# Patient Record
Sex: Male | Born: 1950
Health system: Southern US, Community
[De-identification: ages and names within clinical notes are randomized; demographics above are authoritative.]

## PROBLEM LIST (undated history)

## (undated) DIAGNOSIS — M1990 Unspecified osteoarthritis, unspecified site: Secondary | ICD-10-CM

## (undated) DIAGNOSIS — E785 Hyperlipidemia, unspecified: Secondary | ICD-10-CM

## (undated) DIAGNOSIS — I10 Essential (primary) hypertension: Secondary | ICD-10-CM

## (undated) HISTORY — PX: INGUINAL HERNIA REPAIR: SUR1180

## (undated) HISTORY — DX: Hyperlipidemia, unspecified: E78.5

## (undated) HISTORY — PX: WISDOM TOOTH EXTRACTION: SHX21

## (undated) HISTORY — DX: Unspecified osteoarthritis, unspecified site: M19.90

## (undated) HISTORY — DX: Essential (primary) hypertension: I10

---

## 2003-10-12 ENCOUNTER — Emergency Department (HOSPITAL_COMMUNITY): Admission: EM | Admit: 2003-10-12 | Discharge: 2003-10-12 | Payer: Self-pay | Admitting: Emergency Medicine

## 2003-10-14 ENCOUNTER — Emergency Department (HOSPITAL_COMMUNITY): Admission: EM | Admit: 2003-10-14 | Discharge: 2003-10-14 | Payer: Self-pay | Admitting: Emergency Medicine

## 2003-10-15 ENCOUNTER — Emergency Department (HOSPITAL_COMMUNITY): Admission: EM | Admit: 2003-10-15 | Discharge: 2003-10-15 | Payer: Self-pay | Admitting: Emergency Medicine

## 2004-04-27 ENCOUNTER — Ambulatory Visit: Payer: Self-pay | Admitting: Family Medicine

## 2005-01-05 ENCOUNTER — Ambulatory Visit: Payer: Self-pay | Admitting: Family Medicine

## 2005-01-07 ENCOUNTER — Ambulatory Visit: Payer: Self-pay | Admitting: Family Medicine

## 2005-08-27 ENCOUNTER — Ambulatory Visit: Payer: Self-pay | Admitting: Family Medicine

## 2006-01-13 ENCOUNTER — Ambulatory Visit: Payer: Self-pay | Admitting: Family Medicine

## 2006-07-01 ENCOUNTER — Ambulatory Visit (HOSPITAL_COMMUNITY): Admission: RE | Admit: 2006-07-01 | Discharge: 2006-07-01 | Payer: Self-pay | Admitting: Family Medicine

## 2006-07-01 ENCOUNTER — Encounter: Payer: Self-pay | Admitting: Family Medicine

## 2006-07-01 LAB — CONVERTED CEMR LAB
ALT: 9 units/L (ref 0–53)
Albumin: 4.5 g/dL (ref 3.5–5.2)
Alkaline Phosphatase: 106 units/L (ref 39–117)
BUN: 12 mg/dL (ref 6–23)
Basophils Absolute: 0 10*3/uL (ref 0.0–0.1)
Bilirubin, Direct: 0.2 mg/dL (ref 0.0–0.3)
Cholesterol: 192 mg/dL (ref 0–200)
Eosinophils Absolute: 0.3 10*3/uL (ref 0.0–0.7)
Glucose, Bld: 119 mg/dL — ABNORMAL HIGH (ref 70–99)
HDL: 46 mg/dL (ref >39)
Hemoglobin: 14.5 g/dL (ref 13.0–17.0)
Lymphs Abs: 2.4 10*3/uL (ref 0.7–3.3)
MCHC: 32.4 g/dL (ref 30.0–36.0)
Monocytes Relative: 6 % (ref 3–11)
Neutro Abs: 8.3 10*3/uL — ABNORMAL HIGH (ref 1.7–7.7)
PSA: 0.39 ng/mL
TSH: 0.586 microintl units/mL (ref 0.350–5.50)
Total Bilirubin: 0.8 mg/dL (ref 0.3–1.2)
Total CHOL/HDL Ratio: 4.2
Total Protein: 7.7 g/dL (ref 6.0–8.3)
Triglycerides: 92 mg/dL (ref <150)

## 2006-07-04 ENCOUNTER — Encounter: Payer: Self-pay | Admitting: Family Medicine

## 2006-07-04 LAB — CONVERTED CEMR LAB: Hgb A1c MFr Bld: 5.1 % (ref 4.6–6.1)

## 2006-07-05 ENCOUNTER — Ambulatory Visit: Payer: Self-pay | Admitting: Family Medicine

## 2006-07-05 DIAGNOSIS — I10 Essential (primary) hypertension: Secondary | ICD-10-CM

## 2006-07-06 ENCOUNTER — Ambulatory Visit (HOSPITAL_COMMUNITY): Admission: RE | Admit: 2006-07-06 | Discharge: 2006-07-06 | Payer: Self-pay | Admitting: Family Medicine

## 2006-08-09 ENCOUNTER — Ambulatory Visit: Payer: Self-pay | Admitting: Family Medicine

## 2007-02-02 ENCOUNTER — Ambulatory Visit: Payer: Self-pay | Admitting: Family Medicine

## 2007-05-18 ENCOUNTER — Encounter: Payer: Self-pay | Admitting: Family Medicine

## 2007-05-26 ENCOUNTER — Encounter: Payer: Self-pay | Admitting: Family Medicine

## 2007-05-26 DIAGNOSIS — R319 Hematuria, unspecified: Secondary | ICD-10-CM

## 2007-05-26 DIAGNOSIS — F528 Other sexual dysfunction not due to a substance or known physiological condition: Secondary | ICD-10-CM

## 2007-05-26 DIAGNOSIS — M479 Spondylosis, unspecified: Secondary | ICD-10-CM | POA: Insufficient documentation

## 2007-05-26 DIAGNOSIS — F17200 Nicotine dependence, unspecified, uncomplicated: Secondary | ICD-10-CM

## 2007-05-26 DIAGNOSIS — M1990 Unspecified osteoarthritis, unspecified site: Secondary | ICD-10-CM | POA: Insufficient documentation

## 2007-05-26 DIAGNOSIS — E785 Hyperlipidemia, unspecified: Secondary | ICD-10-CM

## 2007-05-26 DIAGNOSIS — M19019 Primary osteoarthritis, unspecified shoulder: Secondary | ICD-10-CM | POA: Insufficient documentation

## 2007-08-10 ENCOUNTER — Ambulatory Visit: Payer: Self-pay | Admitting: Family Medicine

## 2008-02-01 ENCOUNTER — Telehealth: Payer: Self-pay | Admitting: Family Medicine

## 2008-02-06 ENCOUNTER — Telehealth: Payer: Self-pay | Admitting: Family Medicine

## 2008-02-13 ENCOUNTER — Telehealth: Payer: Self-pay | Admitting: Family Medicine

## 2008-10-12 ENCOUNTER — Emergency Department (HOSPITAL_COMMUNITY): Admission: EM | Admit: 2008-10-12 | Discharge: 2008-10-12 | Payer: Self-pay | Admitting: Emergency Medicine

## 2009-01-17 ENCOUNTER — Emergency Department (HOSPITAL_COMMUNITY): Admission: EM | Admit: 2009-01-17 | Discharge: 2009-01-17 | Payer: Self-pay | Admitting: Emergency Medicine

## 2009-01-21 ENCOUNTER — Emergency Department (HOSPITAL_COMMUNITY): Admission: EM | Admit: 2009-01-21 | Discharge: 2009-01-21 | Payer: Self-pay | Admitting: Emergency Medicine

## 2009-11-25 ENCOUNTER — Encounter: Payer: Self-pay | Admitting: Family Medicine

## 2010-06-16 NOTE — Letter (Signed)
Summary: Historic Patient File  Historic Patient File   Imported By: Lind Guest 11/20/2009 13:27:21  _____________________________________________________________________  External Attachment:    Type:   Image     Comment:   External Document

## 2010-06-16 NOTE — Letter (Signed)
Summary: Letter  Letter   Imported By: Lind Guest 11/25/2009 16:41:46  _____________________________________________________________________  External Attachment:    Type:   Image     Comment:   External Document

## 2015-01-28 ENCOUNTER — Ambulatory Visit (AMBULATORY_SURGERY_CENTER): Payer: Self-pay | Admitting: *Deleted

## 2015-01-28 VITALS — Ht 71.0 in | Wt 207.0 lb

## 2015-01-28 DIAGNOSIS — Z1211 Encounter for screening for malignant neoplasm of colon: Secondary | ICD-10-CM

## 2015-01-28 NOTE — Progress Notes (Signed)
Patient denies any allergies to egg or soy products. No prior surgical history/sedation.  Patient denies oxygen use at home and denies diet medications. Emmi instructions for colonoscopy explained and given to patient.

## 2015-01-28 NOTE — Progress Notes (Signed)
Patient is on a blood pressure medication.  Patient is not sure of name of medication.  Patient will bring name of blood pressure medication with him on day of the colonoscopy procedure.

## 2015-02-11 ENCOUNTER — Ambulatory Visit (AMBULATORY_SURGERY_CENTER): Payer: Medicare Other | Admitting: Gastroenterology

## 2015-02-11 ENCOUNTER — Encounter: Payer: Self-pay | Admitting: Gastroenterology

## 2015-02-11 VITALS — BP 119/70 | HR 56 | Temp 96.4°F | Resp 20 | Ht 71.0 in | Wt 207.0 lb

## 2015-02-11 DIAGNOSIS — D129 Benign neoplasm of anus and anal canal: Secondary | ICD-10-CM

## 2015-02-11 DIAGNOSIS — D122 Benign neoplasm of ascending colon: Secondary | ICD-10-CM | POA: Diagnosis not present

## 2015-02-11 DIAGNOSIS — D128 Benign neoplasm of rectum: Secondary | ICD-10-CM | POA: Diagnosis not present

## 2015-02-11 DIAGNOSIS — Z1211 Encounter for screening for malignant neoplasm of colon: Secondary | ICD-10-CM | POA: Diagnosis not present

## 2015-02-11 DIAGNOSIS — D124 Benign neoplasm of descending colon: Secondary | ICD-10-CM

## 2015-02-11 DIAGNOSIS — D127 Benign neoplasm of rectosigmoid junction: Secondary | ICD-10-CM

## 2015-02-11 DIAGNOSIS — D123 Benign neoplasm of transverse colon: Secondary | ICD-10-CM

## 2015-02-11 DIAGNOSIS — K635 Polyp of colon: Secondary | ICD-10-CM

## 2015-02-11 DIAGNOSIS — D125 Benign neoplasm of sigmoid colon: Secondary | ICD-10-CM

## 2015-02-11 MED ORDER — SODIUM CHLORIDE 0.9 % IV SOLN: 500.0000 mL | INTRAVENOUS | Status: DC

## 2015-02-11 NOTE — Progress Notes (Signed)
tO RECOVERY, REPORT TO wILLIS, rn, vss

## 2015-02-11 NOTE — Patient Instructions (Addendum)
YOU HAD AN ENDOSCOPIC PROCEDURE TODAY AT [facility]:   Refer to the procedure report that was given to you for any specific questions about what was found during the examination.  If the procedure report does not answer your questions, please call your gastroenterologist to clarify.  If you requested that your care partner not be given the details of your procedure findings, then the procedure report has been included in a sealed envelope for you to review at your convenience later.  YOU SHOULD EXPECT: Some feelings of bloating in the abdomen. Passage of more gas than usual.  Walking can help get rid of the air that was put into your GI tract during the procedure and reduce the bloating. If you had a lower endoscopy (such as a colonoscopy or flexible sigmoidoscopy) you may notice spotting of blood in your stool or on the toilet paper. If you underwent a bowel prep for your procedure, you may not have a normal bowel movement for a few days.  Please Note:  You might notice some irritation and congestion in your nose or some drainage.  This is from the oxygen used during your procedure.  There is no need for concern and it should clear up in a day or so.  SYMPTOMS TO REPORT IMMEDIATELY:   Following lower endoscopy (colonoscopy or flexible sigmoidoscopy):  Excessive amounts of blood in the stool  Significant tenderness or worsening of abdominal pains  Swelling of the abdomen that is new, acute  Fever of 100F or higher   For urgent or emergent issues, a gastroenterologist can be reached at any hour by calling [phone].   DIET: Your first meal following the procedure should be a small meal and then it is ok to progress to your normal diet. Heavy or fried foods are harder to digest and may make you feel nauseous or bloated.  Likewise, meals heavy in dairy and vegetables can increase bloating.  Drink plenty of fluids but you should avoid alcoholic beverages for 24  hours.  ACTIVITY:  You should plan to take it easy for the rest of today and you should NOT DRIVE or use heavy machinery until tomorrow (because of the sedation medicines used during the test).    FOLLOW UP: Our staff will call the number listed on your records the next business day following your procedure to check on you and address any questions or concerns that you may have regarding the information given to you following your procedure. If we do not reach you, we will leave a message.  However, if you are feeling well and you are not experiencing any problems, there is no need to return our call.  We will assume that you have returned to your regular daily activities without incident.  If any biopsies were taken you will be contacted by phone or by letter within the next 1-3 weeks.  Please call us at [phone] if you have not heard about the biopsies in 3 weeks.    SIGNATURES/CONFIDENTIALITY: You and/or your care partner have signed paperwork which will be entered into your electronic medical record.  These signatures attest to the fact that that the information above on your After Visit Summary has been reviewed and is understood.  Full responsibility of the confidentiality of this discharge information lies with you and/or your care-partner.    Handouts were given to your care partner on polyps, hemorrhoids, and a high fiber diet with liberal fluid intake Avoid all aspirin  and NSAIDS for 2 weeks to minimize bleeding from where polyps were removed. You may resume your other current medications today. Await biopsy results. Please call if any questions or concerns.

## 2015-02-11 NOTE — Progress Notes (Signed)
No problems noted in the recovery room. maw 

## 2015-02-11 NOTE — Op Note (Signed)
[place]  [address] [place], [id number]   COLONOSCOPY PROCEDURE REPORT  PATIENT: Albert Martin, Albert Martin  MR#: [id number] BIRTHDATE: [birth year] , 72  yrs. old GENDER: male ENDOSCOPIST: Samak Cellar, MD REFERRED BY: Jenell Milliner, MD PROCEDURE DATE:  02/11/2015 PROCEDURE:   Colonoscopy with snare polypectomy and Submucosal injection, any substance First Screening Colonoscopy - Avg.  risk and is 50 yrs.  old or older Yes.  Prior Negative Screening - Now for repeat screening. N/A  History of Adenoma - Now for follow-up colonoscopy & has been > or = to 3 yrs.  N/A  Polyps removed today? Yes ASA CLASS:   Class II INDICATIONS:Screening for colonic neoplasia and Colorectal Neoplasm Risk Assessment for this procedure is average risk. MEDICATIONS: Propofol 300 mg IV  DESCRIPTION OF PROCEDURE:   After the risks benefits and alternatives of the procedure were thoroughly explained, informed consent was obtained.  The digital rectal exam revealed no abnormalities of the rectum.   The LB [id number]  endoscope was introduced through the anus and advanced to the cecum, which was identified by both the appendix and ileocecal valve. No adverse events experienced.   The quality of the prep was good.  The instrument was then slowly withdrawn as the colon was fully examined. Estimated blood loss is zero unless otherwise noted in this procedure report.      COLON FINDINGS: Thirteen sessile polyps ranging from 3 to 61mm in size were found in the transverse colon, at the splenic flexure, in the ascending colon, at the hepatic flexure, and in the rectum. Polypectomies were performed with a cold snare.  The resection was complete, the polyp tissue was completely retrieved and sent to histology.   Three semi-pedunculated polyps ranging from 5 to 79mm in size were found in the sigmoid colon and descending colon. Polypectomies were performed using snare cautery.  The  resection was complete, the polyp tissue was completely retrieved and sent to histology.   A pedunculated polyp measuring 25 to 30 mm in size was found in the rectosigmoid colon.  Upon intubation of the colon, a 1:10,000 Epinephrine solution injection was given to minimize the head of the polyp to allow the snare to fit around it. A polypectomy was then performed using snare cautery.  The resection was complete, the polyp tissue was completely retrieved and sent to histology.  There was a large stalk to the polyp and given the patient's NSAID usage, a prophylactic hemostasis clips was successfully placed across the defect to prevent bleeding. The examination was otherwise normal.  Retroflexed views revealed internal hemorrhoids. The time to cecum =    Withdrawal time = The scope was withdrawn and the procedure completed. COMPLICATIONS: There were no immediate complications.  ENDOSCOPIC IMPRESSION: 1.   Thirteen sessile polyps ranging from 3 to 69mm in size were found in the transverse colon (4), at the splenic flexure (1), in the ascending colon (1), and at the hepatic flexure (3), and in the rectum (4); polypectomies were performed with a cold snare 2.   Three semi-pedunculated polyps ranging from 5 to 80mm in size were found in the sigmoid colon and descending colon; polypectomies were performed using snare cautery 3.   Pedunculated polyp was found in the rectosigmoid colon; injection was given to allow the snare to fit around the head of the polyp.  1:10,000 Epinephrine solution; polypectomy was performed using snare cautery. Prophylactic hemostasis clip placed.  4.   The examination was otherwise normal  RECOMMENDATIONS: 1.  Hold Aspirin and all other NSAIDS for 2 weeks to minimize bleeding from polypectomy site. 2.  Resume diet 3.  Resume medications 4.  Await pathology results.  Anticipate repeat colonoscopy will be warranted in one year for surveillance purposes.  eSigned:    Cellar, MD 02/11/2015 11:23 AM   cc: Jenell Milliner, MD, the patient   PATIENT NAME:  Albert Martin, Albert Martin MR#: [id number]

## 2015-02-12 ENCOUNTER — Telehealth: Payer: Self-pay | Admitting: *Deleted

## 2015-02-12 NOTE — Telephone Encounter (Signed)
  Follow up Call-  Call back number 02/11/2015  Post procedure Call Back phone  # [phone]  Permission to leave phone message Yes   No answer, no machine to leave message

## 2015-02-14 NOTE — Telephone Encounter (Signed)
Phone call in error. maw

## 2015-02-19 ENCOUNTER — Encounter: Payer: Self-pay | Admitting: Gastroenterology

## 2015-12-08 ENCOUNTER — Encounter: Payer: Self-pay | Admitting: Gastroenterology

## 2018-06-12 ENCOUNTER — Ambulatory Visit (INDEPENDENT_AMBULATORY_CARE_PROVIDER_SITE_OTHER): Payer: Medicare Other | Admitting: Podiatry

## 2018-06-12 ENCOUNTER — Ambulatory Visit (INDEPENDENT_AMBULATORY_CARE_PROVIDER_SITE_OTHER): Payer: Medicare Other

## 2018-06-12 ENCOUNTER — Other Ambulatory Visit: Payer: Self-pay | Admitting: Podiatry

## 2018-06-12 VITALS — BP 164/86 | HR 87

## 2018-06-12 DIAGNOSIS — M79671 Pain in right foot: Secondary | ICD-10-CM

## 2018-06-12 DIAGNOSIS — M722 Plantar fascial fibromatosis: Secondary | ICD-10-CM | POA: Diagnosis not present

## 2018-06-12 MED ORDER — METHYLPREDNISOLONE 4 MG PO TBPK: ORAL_TABLET | ORAL | 0 refills | Status: DC

## 2018-06-12 MED ORDER — TRIAMCINOLONE ACETONIDE 10 MG/ML IJ SUSP
10.0000 mg | Freq: Once | INTRAMUSCULAR | Status: AC
  Administered 2018-06-12: 10 mg

## 2018-06-12 NOTE — Patient Instructions (Signed)

## 2018-06-12 NOTE — Progress Notes (Signed)
Subjective:   Patient ID: Albert Martin, male   DOB: 68 y.o.   MRN: [id number]   HPI Mr. Gunn presents to the office today for concerns of pain to the bottom of his right heel which is been ongoing for the last 12 days.  He denies any recent injury to the area he has not seen much swelling.  He states that it was gradually coming on for the first 4 days and then became more significant on the bottom of the heel which is worse in the morning when he first gets up after being on his feet all day.  He denies any recent injury or trauma.  He has tried a gel cushion for shoes as well as tramadol.  He is applied ice as well as Epsom salts soaks.   Review of Systems  All other systems reviewed and are negative.  Past Medical History:  Diagnosis Date  . Hypertension     Past Surgical History:  Procedure Laterality Date  . WISDOM TOOTH EXTRACTION       Current Outpatient Medications:  .  hydrochlorothiazide (HYDRODIURIL) 25 MG tablet, Take 25 mg by mouth daily., Disp: , Rfl:  .  meloxicam (MOBIC) 15 MG tablet, Take 15 mg by mouth daily., Disp: , Rfl:  .  Naproxen Sodium (ALEVE) 220 MG CAPS, Take 220 mg by mouth 2 (two) times daily., Disp: , Rfl:  .  sildenafil (REVATIO) 20 MG tablet, Take 20 mg by mouth., Disp: , Rfl: 0 .  testosterone cypionate (DEPOTESTOSTERONE CYPIONATE) 200 MG/ML injection, , Disp: , Rfl:  .  amLODipine (NORVASC) 5 MG tablet, Take 5 mg by mouth daily., Disp: , Rfl:  .  methylPREDNISolone (MEDROL DOSEPAK) 4 MG TBPK tablet, Take as directed, Disp: 21 tablet, Rfl: 0 .  rosuvastatin (CRESTOR) 10 MG tablet, Take 1 tablet by mouth daily before going to sleep, Disp: , Rfl:  .  traMADol (ULTRAM) 50 MG tablet, Take 50-100 mg by mouth every 6 (six) hours as needed. for pain, Disp: , Rfl:   No Known Allergies        Objective:  Physical Exam  General: AAO x3, NAD  Dermatological: Skin is warm, dry and supple bilateral. Nails x 10 are well manicured; remaining  integument appears unremarkable at this time. There are no open sores, no preulcerative lesions, no rash or signs of infection present.  Vascular: Dorsalis Pedis artery and Posterior Tibial artery pedal pulses are 2/4 bilateral with immedate capillary fill time.  There is no pain with calf compression, swelling, warmth, erythema.   Neruologic: Grossly intact via light touch bilateral. Vibratory intact via tuning fork bilateral. Protective threshold with Semmes Wienstein monofilament intact to all pedal sites bilateral. Negative tinel sign.   Musculoskeletal: Tenderness to palpation along the plantar medial tubercle of the calcaneus at the insertion of plantar fascia on the right foot. There is no pain along the course of the plantar fascia within the arch of the foot. Plantar fascia appears to be intact. There is no pain with lateral compression of the calcaneus or pain with vibratory sensation. There is no pain along the course or insertion of the achilles tendon. No other areas of tenderness to bilateral lower extremities. Muscular strength 5/5 in all groups tested bilateral.  Gait: Unassisted, Nonantalgic.       Assessment:   Right heel pain, plantar fasciitis    Plan:  -Treatment options discussed including all alternatives, risks, and complications -Etiology of symptoms were discussed -X-rays were  obtained and reviewed with the patient.  Flatfoot is present.  Mild arthritic changes present.  No evidence of acute fracture or stress fracture.  Very minimal tiny inferior heel spur. -Steroid injection performed.  See procedure note below. -Plantar fascial brace dispensed. -Discussed shoe modifications and orthotics.  Power steps dispensed. -Stretching, icing daily  Procedure: Injection Tendon/Ligament Discussed alternatives, risks, complications and verbal consent was obtained.  Location: Right plantar fascia at the glabrous junction; medial approach. Skin Prep: Alcohol. Injectate:  0.5cc 0.5% marcaine plain, 0.5 cc 2% lidocaine plain and, 1 cc kenalog 10. Disposition: Patient tolerated procedure well. Injection site dressed with a band-aid.  Post-injection care was discussed and return precautions discussed.     Trula Slade DPM

## 2018-07-03 ENCOUNTER — Ambulatory Visit: Payer: Medicare Other | Admitting: Podiatry

## 2018-07-17 ENCOUNTER — Ambulatory Visit: Payer: Medicare Other | Admitting: Podiatry

## 2018-09-15 ENCOUNTER — Ambulatory Visit: Payer: Medicare Other | Attending: Internal Medicine | Admitting: Internal Medicine

## 2018-09-15 ENCOUNTER — Encounter: Payer: Self-pay | Admitting: Internal Medicine

## 2018-09-15 ENCOUNTER — Ambulatory Visit: Payer: Medicare Other | Admitting: Internal Medicine

## 2018-09-15 ENCOUNTER — Other Ambulatory Visit: Payer: Self-pay

## 2018-09-15 VITALS — BP 137/86 | HR 80 | Temp 99.2°F | Resp 16 | Ht 71.0 in | Wt 208.0 lb

## 2018-09-15 DIAGNOSIS — Z791 Long term (current) use of non-steroidal anti-inflammatories (NSAID): Secondary | ICD-10-CM | POA: Insufficient documentation

## 2018-09-15 DIAGNOSIS — N529 Male erectile dysfunction, unspecified: Secondary | ICD-10-CM | POA: Diagnosis not present

## 2018-09-15 DIAGNOSIS — Z8249 Family history of ischemic heart disease and other diseases of the circulatory system: Secondary | ICD-10-CM | POA: Diagnosis not present

## 2018-09-15 DIAGNOSIS — I10 Essential (primary) hypertension: Secondary | ICD-10-CM | POA: Diagnosis not present

## 2018-09-15 DIAGNOSIS — R7989 Other specified abnormal findings of blood chemistry: Secondary | ICD-10-CM | POA: Insufficient documentation

## 2018-09-15 DIAGNOSIS — E785 Hyperlipidemia, unspecified: Secondary | ICD-10-CM | POA: Insufficient documentation

## 2018-09-15 DIAGNOSIS — Z87891 Personal history of nicotine dependence: Secondary | ICD-10-CM | POA: Diagnosis not present

## 2018-09-15 DIAGNOSIS — Z86010 Personal history of colonic polyps: Secondary | ICD-10-CM | POA: Diagnosis not present

## 2018-09-15 DIAGNOSIS — M150 Primary generalized (osteo)arthritis: Secondary | ICD-10-CM

## 2018-09-15 DIAGNOSIS — Z125 Encounter for screening for malignant neoplasm of prostate: Secondary | ICD-10-CM | POA: Insufficient documentation

## 2018-09-15 DIAGNOSIS — Z23 Encounter for immunization: Secondary | ICD-10-CM

## 2018-09-15 DIAGNOSIS — M1991 Primary osteoarthritis, unspecified site: Secondary | ICD-10-CM | POA: Diagnosis not present

## 2018-09-15 DIAGNOSIS — M159 Polyosteoarthritis, unspecified: Secondary | ICD-10-CM

## 2018-09-15 DIAGNOSIS — Z79899 Other long term (current) drug therapy: Secondary | ICD-10-CM | POA: Insufficient documentation

## 2018-09-15 MED ORDER — TADALAFIL 20 MG PO TABS: ORAL_TABLET | ORAL | 6 refills | Status: DC

## 2018-09-15 MED ORDER — AMLODIPINE BESYLATE 10 MG PO TABS: 10.0000 mg | ORAL_TABLET | Freq: Every day | ORAL | 3 refills | Status: DC

## 2018-09-15 NOTE — Patient Instructions (Signed)
Increase Amlodipine to 10 mg daily.  Continue healthy eating habits and regular exercise. Come fasting to the lab on Monday.

## 2018-09-15 NOTE — Progress Notes (Signed)
Patient ID: Albert Martin, male    DOB: 1950/12/08  MRN: [id number]  CC: New Patient (Initial Visit)   Subjective: Albert Martin is a 68 y.o. male who presents for new pt visit His concerns today include:  Patient with history of HTN, HL, ED, low Testosterone, CTS,  Last PCP was at [facility].  Last seen there 3 mths ago.  Decided to change because he states the primary physician was never there, always saw a NP and took 4 hrs to be seen.  He was being treated for HTN, HL, ED low testosterone on inj Q 2 wks for past 2 yrs and arthritis.    ED/Low T: He has been on testosterone injections every 2 weeks for about 2 years for low testosterone level.  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  He last took a shot about 3 to 4 weeks ago.  He still has some at home but is saving it to use when he returns to work in about 3 weeks.  He states that it helps improve his energy.   He is not sure whether his PSA level and CBC were being followed since he is been on testosterone. -passing urine okay.  No Nocturia.   -Cialis works well for him for ED.  Requests refill.  HL:  Dx 1.5 yrs ago and started on Crestor at that time.  "I've change my diet.  Cut down on that fried chicken and all the other nonsense."  HTN:  Checks BP daily.  Gives range 128-138/82-88.  Compliant with meds.  -limits salt in foods. No CP/SOB/LE/HA/dizziness Exercises 2-4 x a wk.  Has wghs and stationary bike at home  Former smoker:  Quit 9 yrs ago  OA:  Mainly R shoulder and knee.  Meloxicam works but it takes a while. Takes Tylenol 650 mg as back up when he has flares like in cold or rainy weather.  HM: c-scope 2 yrs ago where 17 polyps removed in 2016 by Dr. Havery Moros.  He was supposed to have a repeat colonoscopy the following year but never did he has not had the pneumonia vaccine series.   States that he is overdue for tetanus vaccine.  Social history, family history and past surgical history reviewed  Patient Active Problem List   Diagnosis Date Noted  . DYSLIPIDEMIA 05/26/2007  . ERECTILE DYSFUNCTION 05/26/2007  . TOBACCO ABUSE 05/26/2007  . HEMATURIA UNSPECIFIED 05/26/2007  . OSTEOARTHRITIS 05/26/2007  . ARTHRITIS, SHOULDERS, BILATERAL 05/26/2007  . ARTHRITIS, BACK 05/26/2007  . HYPERTENSION 07/05/2006     Current Outpatient Medications on File Prior to Visit  Medication Sig Dispense Refill  . tadalafil (ADCIRCA/CIALIS) 20 MG tablet Take 20 mg by mouth.    . hydrochlorothiazide (HYDRODIURIL) 25 MG tablet Take 25 mg by mouth daily.    . meloxicam (MOBIC) 15 MG tablet Take 15 mg by mouth daily.    . rosuvastatin (CRESTOR) 10 MG tablet Take 1 tablet by mouth daily before going to sleep    . testosterone cypionate (DEPOTESTOSTERONE CYPIONATE) 200 MG/ML injection      No current facility-administered medications on file prior to visit.     No Known Allergies  Social History   Socioeconomic History  . Marital status: Married    Spouse name: Not on file  . Number of children: 4  . Years of education: Not  on file  . Highest education level: Not on file  Occupational History  . Not on file  Social Needs  . Financial resource strain: Not on file  . Food insecurity:    Worry: Not on file    Inability: Not on file  . Transportation needs:    Medical: Not on file    Non-medical: Not on file  Tobacco Use  . Smoking status: Former Smoker    Packs/day: 0.50    Years: 10.00    Pack years: 5.00    Types: Cigarettes    Last attempt to quit: 08/15/2009    Years since quitting: 9.0  . Smokeless tobacco: Never Used  Substance and Sexual Activity  . Alcohol use: No    Alcohol/week: 0.0 standard drinks  . Drug use: No  . Sexual activity: Yes  Lifestyle  . Physical activity:    Days per week: Not on file    Minutes per session: Not on file  . Stress: Not on  file  Relationships  . Social connections:    Talks on phone: Not on file    Gets together: Not on file    Attends religious service: Not on file    Active member of club or organization: Not on file    Attends meetings of clubs or organizations: Not on file    Relationship status: Not on file  . Intimate partner violence:    Fear of current or ex partner: Not on file    Emotionally abused: Not on file    Physically abused: Not on file    Forced sexual activity: Not on file  Other Topics Concern  . Not on file  Social History Narrative  . Not on file    Family History  Problem Relation Age of Onset  . Lung cancer Mother   . Hypertension Mother   . Arthritis Mother   . Hypertension Father   . Arthritis Father   . Hypertension Brother   . Arthritis Brother   . Lung cancer Maternal Aunt   . Colon cancer Neg Hx   . Colon polyps Neg Hx   . Esophageal cancer Neg Hx   . Rectal cancer Neg Hx   . Stomach cancer Neg Hx     Past Surgical History:  Procedure Laterality Date  . WISDOM TOOTH EXTRACTION      ROS: Review of Systems  Constitutional: Negative for appetite change.  HENT: Negative for congestion and hearing loss.        He wears dentures above and below  Eyes: Negative for visual disturbance.       Last eye exam was about 2 years ago.  He wears prescription glasses  Respiratory: Negative for cough and chest tightness.   Gastrointestinal: Negative for abdominal pain and blood in stool.  Genitourinary: Negative for difficulty urinating and dysuria.   Negative except as stated above  PHYSICAL EXAM: BP 137/86   Pulse 80   Temp 99.2 F (37.3 C) (Oral)   Resp 16   Ht 5\' 11"  (1.803 m)   Wt 208 lb (94.3 kg)   SpO2 95%   BMI 29.01 kg/m   Physical Exam Repeat blood pressure 134/90 General appearance - alert, well appearing, African-American male who appears younger than his stated age and in no distress Mental status - normal mood, behavior, speech, dress,  motor activity, and thought processes Eyes - pupils equal and reactive, extraocular eye movements intact Ears - bilateral TM's and external ear canals  normal Nose - normal and patent, no erythema, discharge or polyps Mouth -throat is without erythema or exudates.  He is wearing his dentures above and below Neck - supple, no masses appreciated.  No thyroid enlargement Lymphatics -no cervical, axillary lymphadenopathy  chest - clear to auscultation, no wheezes, rales or rhonchi, symmetric air entry Heart - normal rate, regular rhythm, normal S1, S2, no murmurs, rubs, clicks or gallops Abdomen - soft, nontender, nondistended, no masses or organomegaly Extremities -no lower extremity edema.  Good peripheral pulses.  CMP Latest Ref Rng & Units 07/01/2006  Glucose 70 - 99 mg/dL 119(H)  BUN 6 - 23 mg/dL 12  Creatinine 0.40 - 1.50 mg/dL 0.94  Sodium 135 - 145 meq/L 141  Potassium 3.5 - 5.3 meq/L 3.9  Chloride 96 - 112 meq/L 105  CO2 19 - 32 meq/L 22  Calcium 8.4 - 10.5 mg/dL 9.5  Total Protein 6.0 - 8.3 g/dL 7.7  Total Bilirubin 0.3 - 1.2 mg/dL 0.8  Alkaline Phos 39 - 117 units/L 106  AST 0 - 37 units/L 11  ALT 0 - 53 units/L 9   Lipid Panel     Component Value Date/Time   CHOL 192 07/01/2006 2201   TRIG 92 07/01/2006 2201   HDL 46 07/01/2006 2201   CHOLHDL 4.2 Ratio 07/01/2006 2201   VLDL 18 07/01/2006 2201   LDLCALC 128 (H) 07/01/2006 2201    CBC    Component Value Date/Time   WBC 11.6 (H) 07/01/2006 2201   RBC 5.22 07/01/2006 2201   HGB 14.5 07/01/2006 2201   HCT 44.8 07/01/2006 2201   PLT 254 07/01/2006 2201   MCV 85.8 07/01/2006 2201   MCHC 32.4 07/01/2006 2201   RDW 13.4 07/01/2006 2201   LYMPHSABS 2.4 07/01/2006 2201   MONOABS 0.7 07/01/2006 2201   EOSABS 0.3 07/01/2006 2201   BASOSABS 0.0 07/01/2006 2201    ASSESSMENT AND PLAN: 1. Essential hypertension Not at goal.  Increase amlodipine from 5 mg daily to 10 mg daily.  Continue hydrochlorothiazide.  Encourage  him to continue low-salt diet.  Continue regular exercise. - amLODipine (NORVASC) 10 MG tablet; Take 1 tablet (10 mg total) by mouth daily.  Dispense: 90 tablet; Refill: 3 - CBC; Future - Comprehensive metabolic panel; Future  2. Hyperlipidemia, unspecified hyperlipidemia type Commended him on dietary changes and encouraged him to continue making good food choices. - Lipid panel; Future  3. History of colon polyps Patient agreeable to referral for colonoscopy.  He is overdue. - Ambulatory referral to Gastroenterology  4. Low testosterone level in male Discussed with patient that we do need to keep an eye on CBC, PSA and lipid profile while he is on testosterone supplement. - PSA; Future - Testosterone; Future  5. Erectile dysfunction, unspecified erectile dysfunction type - PSA; Future - Testosterone; Future - tadalafil (ADCIRCA/CIALIS) 20 MG tablet; 1 tab 1/2 hr prior to intercourse PRN  Dispense: 10 tablet; Refill: 6  6. Primary osteoarthritis involving multiple joints Encouraged him to continue regular exercise.  Continue meloxicam  7. Prostate cancer screening Discussed limitations of prostate cancer screening with PSA.  However we are also doing a PSA because he is on testosterone injections - PSA; Future  HM: Patient is due for Prevnar 13 and Tdap.  Our clinical pharmacist needs to run this against his insurance.  We will plan to give those vaccines when he returns to the lab on Monday to have his blood test done.  Advised patient to sign a  release for me to get his medical records from his previous PCP  Patient was given the opportunity to ask questions.  Patient verbalized understanding of the plan and was able to repeat key elements of the plan.   Orders Placed This Encounter  Procedures  . CBC  . Comprehensive metabolic panel  . Lipid panel  . PSA  . Testosterone  . Ambulatory referral to Gastroenterology     Requested Prescriptions   Signed Prescriptions  Disp Refills  . amLODipine (NORVASC) 10 MG tablet 90 tablet 3    Sig: Take 1 tablet (10 mg total) by mouth daily.    Return in about 3 months (around 12/16/2018).  Karle Plumber, MD, FACP

## 2018-09-18 ENCOUNTER — Other Ambulatory Visit: Payer: Self-pay

## 2018-09-18 ENCOUNTER — Ambulatory Visit: Payer: Medicare Other | Attending: Family Medicine

## 2018-09-18 DIAGNOSIS — E785 Hyperlipidemia, unspecified: Secondary | ICD-10-CM

## 2018-09-18 DIAGNOSIS — Z125 Encounter for screening for malignant neoplasm of prostate: Secondary | ICD-10-CM

## 2018-09-18 DIAGNOSIS — Z23 Encounter for immunization: Secondary | ICD-10-CM | POA: Diagnosis not present

## 2018-09-18 DIAGNOSIS — N529 Male erectile dysfunction, unspecified: Secondary | ICD-10-CM

## 2018-09-18 DIAGNOSIS — I10 Essential (primary) hypertension: Secondary | ICD-10-CM

## 2018-09-18 DIAGNOSIS — R7989 Other specified abnormal findings of blood chemistry: Secondary | ICD-10-CM

## 2018-09-18 MED ORDER — TETANUS-DIPHTH-ACELL PERTUSSIS 5-2.5-18.5 LF-MCG/0.5 IM SUSP: 0.5000 mL | INTRAMUSCULAR | 0 refills | Status: DC

## 2018-09-18 MED ORDER — TETANUS-DIPHTH-ACELL PERTUSSIS 5-2-15.5 LF-MCG/0.5 IM SUSP: 0.5000 mL | Freq: Once | INTRAMUSCULAR | 0 refills | Status: DC

## 2018-09-18 MED ORDER — PNEUMOCOCCAL 13-VAL CONJ VACC IM SUSP: 0.5000 mL | INTRAMUSCULAR | 0 refills | Status: DC

## 2018-09-18 MED FILL — ADACEL 5-2-15.5 LF-MCG/0.5: 5-2-15.5 | 1 days supply | Qty: 1 | Fill #0

## 2018-09-18 NOTE — Addendum Note (Signed)
Addended by: Jackelyn Knife on: 09/18/2018 09:19 AM   Modules accepted: Orders

## 2018-09-18 NOTE — Addendum Note (Signed)
Addended by: Rosalio Macadamia on: 09/18/2018 10:14 AM   Modules accepted: Orders

## 2018-09-18 NOTE — Progress Notes (Signed)
Patient arrived ambulatory, alert and orientated to clinic.  Patient is in clinic for immunizations  TDaP and Pneumoccoccal -13 given

## 2018-09-19 LAB — COMPREHENSIVE METABOLIC PANEL
ALT: 24 IU/L (ref 0–44)
AST: 15 IU/L (ref 0–40)
Albumin/Globulin Ratio: 1.5 (ref 1.2–2.2)
Albumin: 4.7 g/dL (ref 3.8–4.8)
Alkaline Phosphatase: 86 IU/L (ref 39–117)
BUN/Creatinine Ratio: 13 (ref 10–24)
BUN: 17 mg/dL (ref 8–27)
Bilirubin Total: 1.2 mg/dL (ref 0.0–1.2)
CO2: 20 mmol/L (ref 20–29)
Calcium: 9.9 mg/dL (ref 8.6–10.2)
Chloride: 100 mmol/L (ref 96–106)
Creatinine, Ser: 1.26 mg/dL (ref 0.76–1.27)
GFR calc Af Amer: 66 mL/min/{1.73_m2} (ref >59)
GFR calc non Af Amer: 57 mL/min/{1.73_m2} — ABNORMAL LOW (ref >59)
Globulin, Total: 3.1 g/dL (ref 1.5–4.5)
Glucose: 105 mg/dL — ABNORMAL HIGH (ref 65–99)
Potassium: 4.2 mmol/L (ref 3.5–5.2)
Sodium: 140 mmol/L (ref 134–144)
Total Protein: 7.8 g/dL (ref 6.0–8.5)

## 2018-09-19 LAB — CBC
Hematocrit: 54.9 % — ABNORMAL HIGH (ref 37.5–51.0)
Hemoglobin: 18.3 g/dL — ABNORMAL HIGH (ref 13.0–17.7)
MCH: 28.2 pg (ref 26.6–33.0)
MCHC: 33.3 g/dL (ref 31.5–35.7)
MCV: 85 fL (ref 79–97)
Platelets: 239 10*3/uL (ref 150–450)
RBC: 6.5 x10E6/uL — ABNORMAL HIGH (ref 4.14–5.80)
RDW: 11.9 % (ref 11.6–15.4)
WBC: 8 10*3/uL (ref 3.4–10.8)

## 2018-09-19 LAB — LIPID PANEL
Chol/HDL Ratio: 5.2 ratio — ABNORMAL HIGH (ref 0.0–5.0)
Cholesterol, Total: 215 mg/dL — ABNORMAL HIGH (ref 100–199)
HDL: 41 mg/dL (ref >39)
LDL Calculated: 138 mg/dL — ABNORMAL HIGH (ref 0–99)
Triglycerides: 180 mg/dL — ABNORMAL HIGH (ref 0–149)
VLDL Cholesterol Cal: 36 mg/dL (ref 5–40)

## 2018-09-19 LAB — PSA: Prostate Specific Ag, Serum: 0.8 ng/mL (ref 0.0–4.0)

## 2018-09-19 LAB — TESTOSTERONE: Testosterone: 345 ng/dL (ref 264–916)

## 2018-09-22 ENCOUNTER — Telehealth: Payer: Self-pay | Admitting: Internal Medicine

## 2018-09-22 MED ORDER — ROSUVASTATIN CALCIUM 20 MG PO TABS: 20.0000 mg | ORAL_TABLET | Freq: Every day | ORAL | 6 refills | Status: DC

## 2018-09-22 NOTE — Telephone Encounter (Signed)
Tried to reach pt via phone to discuss lab results.  I got a recording stating that this pt has a voice mailbox that has not been set up as yet.  I will send pt a lab letter instead.

## 2018-09-22 NOTE — Telephone Encounter (Signed)
Done

## 2018-10-18 ENCOUNTER — Encounter: Payer: Self-pay | Admitting: Gastroenterology

## 2018-11-29 ENCOUNTER — Encounter: Payer: Self-pay | Admitting: Internal Medicine

## 2018-11-29 DIAGNOSIS — M159 Polyosteoarthritis, unspecified: Secondary | ICD-10-CM

## 2018-11-29 MED ORDER — DULOXETINE HCL 20 MG PO CPEP: 20.0000 mg | ORAL_CAPSULE | Freq: Every day | ORAL | 3 refills | Status: DC

## 2018-11-29 NOTE — Addendum Note (Signed)
Addended by: Karle Plumber B on: 11/29/2018 12:07 PM   Modules accepted: Orders

## 2018-12-01 ENCOUNTER — Encounter: Payer: Self-pay | Admitting: Internal Medicine

## 2018-12-01 ENCOUNTER — Other Ambulatory Visit: Payer: Self-pay

## 2018-12-01 ENCOUNTER — Other Ambulatory Visit: Payer: Self-pay | Admitting: Internal Medicine

## 2018-12-01 ENCOUNTER — Ambulatory Visit (AMBULATORY_SURGERY_CENTER): Payer: Self-pay

## 2018-12-01 VITALS — Ht 71.0 in | Wt 204.0 lb

## 2018-12-01 DIAGNOSIS — Z86010 Personal history of colonic polyps: Secondary | ICD-10-CM

## 2018-12-01 NOTE — Progress Notes (Signed)
Denies allergies to eggs or soy products. Denies complication of anesthesia or sedation. Denies use of weight loss medication. Denies use of O2.   Emmi instructions given for colonoscopy.  Pre-Visit was conducted by phone due to Covid 19. Instructions were reviewed and mailed to patients confirmed home address. Patient was encouraged to call if he had any questions regarding instructions.

## 2018-12-01 NOTE — Telephone Encounter (Signed)
Please assist in this request

## 2018-12-01 NOTE — Telephone Encounter (Signed)
1) Medication(s) Requested (by name): MELOXICAM   2) Pharmacy of Choice: [facility] [address]  3) Special Requests: [place]   Approved medications will be sent to the pharmacy, we will reach out if there is an issue.  Requests made after 3pm may not be addressed until the following business day!  If a patient is unsure of the name of the medication(s) please note and ask patient to call back when they are able to provide all info, do not send to responsible party until all information is available!

## 2018-12-03 MED ORDER — MELOXICAM 15 MG PO TABS: 15.0000 mg | ORAL_TABLET | Freq: Every day | ORAL | 5 refills | Status: DC

## 2018-12-14 ENCOUNTER — Telehealth: Payer: Self-pay | Admitting: Gastroenterology

## 2018-12-14 NOTE — Telephone Encounter (Signed)
No answer and mailbox is not setup to leave message regarding Covid-19 screening questions. Covid-19 Screening Questions:   Do you now or have you had a fever in the last 14 days?    Do you have any respiratory symptoms of shortness of breath or cough now or in the last 14 days?    Do you have any family members or close contacts with diagnosed or suspected Covid-19 in the past 14 days?    Have you been tested for Covid-19 and found to be positive?

## 2018-12-15 ENCOUNTER — Ambulatory Visit (AMBULATORY_SURGERY_CENTER): Payer: Medicare Other | Admitting: Gastroenterology

## 2018-12-15 ENCOUNTER — Encounter: Payer: Self-pay | Admitting: Gastroenterology

## 2018-12-15 ENCOUNTER — Other Ambulatory Visit: Payer: Self-pay

## 2018-12-15 VITALS — BP 101/63 | HR 78 | Temp 98.0°F | Resp 19 | Ht 71.0 in | Wt 208.0 lb

## 2018-12-15 DIAGNOSIS — Z86010 Personal history of colonic polyps: Secondary | ICD-10-CM

## 2018-12-15 DIAGNOSIS — D120 Benign neoplasm of cecum: Secondary | ICD-10-CM

## 2018-12-15 DIAGNOSIS — D123 Benign neoplasm of transverse colon: Secondary | ICD-10-CM

## 2018-12-15 DIAGNOSIS — D127 Benign neoplasm of rectosigmoid junction: Secondary | ICD-10-CM

## 2018-12-15 DIAGNOSIS — D122 Benign neoplasm of ascending colon: Secondary | ICD-10-CM | POA: Diagnosis not present

## 2018-12-15 MED ORDER — SODIUM CHLORIDE 0.9 % IV SOLN: 500.0000 mL | Freq: Once | INTRAVENOUS | Status: DC

## 2018-12-15 NOTE — Op Note (Signed)
[place] Patient Name: Albert Martin Procedure Date: 12/15/2018 1:06 PM MRN: [id number] Endoscopist: Remo Lipps P. Havery Moros , MD Age: 68 Referring MD:  Date of Birth: 03-25-1951 Gender: Male Account #: [id number] Procedure:                Colonoscopy Indications:              Surveillance: History of numerous 17 adenomas on                            last colonoscopy (2016), largest 37mm in size Medicines:                Monitored Anesthesia Care Procedure:                Pre-Anesthesia Assessment:                           - Prior to the procedure, a History and Physical                            was performed, and patient medications and                            allergies were reviewed. The patient's tolerance of                            previous anesthesia was also reviewed. The risks                            and benefits of the procedure and the sedation                            options and risks were discussed with the patient.                            All questions were answered, and informed consent                            was obtained. Prior Anticoagulants: The patient has                            taken no previous anticoagulant or antiplatelet                            agents. ASA Grade Assessment: II - A patient with                            mild systemic disease. After reviewing the risks                            and benefits, the patient was deemed in                            satisfactory condition to undergo the procedure.  After obtaining informed consent, the colonoscope                            was passed under direct vision. Throughout the                            procedure, the patient's blood pressure, pulse, and                            oxygen saturations were monitored continuously. The                            Colonoscope was introduced through the anus and                            advanced to  the the cecum, identified by                            appendiceal orifice and ileocecal valve. The                            colonoscopy was performed without difficulty. The                            patient tolerated the procedure well. The quality                            of the bowel preparation was good. Scope In: 1:09:11 PM Scope Out: 1:34:52 PM Scope Withdrawal Time: 0 hours 18 minutes 41 seconds  Total Procedure Duration: 0 hours 25 minutes 41 seconds  Findings:                 The perianal and digital rectal examinations were                            normal.                           A 4 mm polyp was found in the cecum. The polyp was                            sessile. The polyp was removed with a cold snare.                            Resection and retrieval were complete.                           A diminutive polyp was found in the ascending                            colon. The polyp was sessile. The polyp was removed                            with a cold snare. Resection and retrieval were  complete.                           Three sessile polyps were found in the hepatic                            flexure. The polyps were 3 to 6 mm in size. These                            polyps were removed with a cold snare. Resection                            and retrieval were complete.                           Two sessile polyps were found in the transverse                            colon. The polyps were 3 mm in size. These polyps                            were removed with a cold snare. Resection and                            retrieval were complete.                           Four sessile polyps were found in the recto-sigmoid                            colon. The polyps were 3 to 4 mm in size. These                            polyps were removed with a cold snare. Resection                            and retrieval were complete.                            Internal hemorrhoids were found during retroflexion.                           The exam was otherwise without abnormality. Complications:            No immediate complications. Estimated blood loss:                            Minimal. Estimated Blood Loss:     Estimated blood loss was minimal. Impression:               - One 4 mm polyp in the cecum, removed with a cold                            snare. Resected and retrieved.                           -  One diminutive polyp in the ascending colon,                            removed with a cold snare. Resected and retrieved.                           - Three 3 to 6 mm polyps at the hepatic flexure,                            removed with a cold snare. Resected and retrieved.                           - Two 3 mm polyps in the transverse colon, removed                            with a cold snare. Resected and retrieved.                           - Four 3 to 4 mm polyps at the recto-sigmoid colon,                            removed with a cold snare. Resected and retrieved.                           - Internal hemorrhoids.                           - The examination was otherwise normal. Recommendation:           - Patient has a contact number available for                            emergencies. The signs and symptoms of potential                            delayed complications were discussed with the                            patient. Return to normal activities tomorrow.                            Written discharge instructions were provided to the                            patient.                           - Resume previous diet.                           - Continue present medications.                           - Await pathology results. Remo Lipps P. Tuyet Bader, MD 12/15/2018 1:41:34 PM This report has been signed  electronically.

## 2018-12-15 NOTE — Progress Notes (Signed)
Pt's states no medical or surgical changes since previsit or office visit.  Temps PJ Vitals CW

## 2018-12-15 NOTE — Progress Notes (Signed)
To PACU, VSS. Report to [email or web address] 

## 2018-12-15 NOTE — Patient Instructions (Signed)
Impression/Recommendations:  Polyp handout given to patient. Hemorrhoid handout given to patient.  Resume previous diet. Continue present medications. Await pathology results.  YOU HAD AN ENDOSCOPIC PROCEDURE TODAY AT [facility]:   Refer to the procedure report that was given to you for any specific questions about what was found during the examination.  If the procedure report does not answer your questions, please call your gastroenterologist to clarify.  If you requested that your care partner not be given the details of your procedure findings, then the procedure report has been included in a sealed envelope for you to review at your convenience later.  YOU SHOULD EXPECT: Some feelings of bloating in the abdomen. Passage of more gas than usual.  Walking can help get rid of the air that was put into your GI tract during the procedure and reduce the bloating. If you had a lower endoscopy (such as a colonoscopy or flexible sigmoidoscopy) you may notice spotting of blood in your stool or on the toilet paper. If you underwent a bowel prep for your procedure, you may not have a normal bowel movement for a few days.  Please Note:  You might notice some irritation and congestion in your nose or some drainage.  This is from the oxygen used during your procedure.  There is no need for concern and it should clear up in a day or so.  SYMPTOMS TO REPORT IMMEDIATELY:   Following lower endoscopy (colonoscopy or flexible sigmoidoscopy):  Excessive amounts of blood in the stool  Significant tenderness or worsening of abdominal pains  Swelling of the abdomen that is new, acute  Fever of 100F or higher For urgent or emergent issues, a gastroenterologist can be reached at any hour by calling [phone].   DIET:  We do recommend a small meal at first, but then you may proceed to your regular diet.  Drink plenty of fluids but you should avoid alcoholic beverages for 24  hours.  ACTIVITY:  You should plan to take it easy for the rest of today and you should NOT DRIVE or use heavy machinery until tomorrow (because of the sedation medicines used during the test).    FOLLOW UP: Our staff will call the number listed on your records 48-72 hours following your procedure to check on you and address any questions or concerns that you may have regarding the information given to you following your procedure. If we do not reach you, we will leave a message.  We will attempt to reach you two times.  During this call, we will ask if you have developed any symptoms of COVID 19. If you develop any symptoms (ie: fever, flu-like symptoms, shortness of breath, cough etc.) before then, please call [phone].  If you test positive for Covid 19 in the 2 weeks post procedure, please call and report this information to [place].    If any biopsies were taken you will be contacted by phone or by letter within the next 1-3 weeks.  Please call us at [phone] if you have not heard about the biopsies in 3 weeks.    SIGNATURES/CONFIDENTIALITY: You and/or your care partner have signed paperwork which will be entered into your electronic medical record.  These signatures attest to the fact that that the information above on your After Visit Summary has been reviewed and is understood.  Full responsibility of the confidentiality of this discharge information lies with you and/or your care-partner.

## 2018-12-15 NOTE — Progress Notes (Signed)
Called to room to assist during endoscopic procedure.  Patient ID and intended procedure confirmed with present staff. Received instructions for my participation in the procedure from the performing physician.  

## 2018-12-15 NOTE — Progress Notes (Signed)
Transporter ([place]) took pt. Down to meet ride.  Care partner sitting downstairs, and said they needed to call a taxi.  Admitting not advised of this.  Pt. Returned with transporter in wheelchair to recovery area.  Discharge time cancelled, and revised.

## 2018-12-15 NOTE — Progress Notes (Signed)
Pt. Alert at time of discharge. Pt. Asleep when Dr. Havery Moros first came by to give report.  Kept pt. In recovery until after the following case, so he could receive a report from his Dr.  Hence disparity in discharge time.

## 2018-12-18 ENCOUNTER — Ambulatory Visit: Payer: Medicare Other | Attending: Internal Medicine | Admitting: Internal Medicine

## 2018-12-18 ENCOUNTER — Other Ambulatory Visit: Payer: Self-pay

## 2018-12-18 ENCOUNTER — Encounter: Payer: Self-pay | Admitting: Internal Medicine

## 2018-12-18 DIAGNOSIS — N529 Male erectile dysfunction, unspecified: Secondary | ICD-10-CM | POA: Diagnosis not present

## 2018-12-18 DIAGNOSIS — I10 Essential (primary) hypertension: Secondary | ICD-10-CM

## 2018-12-18 DIAGNOSIS — D751 Secondary polycythemia: Secondary | ICD-10-CM

## 2018-12-18 DIAGNOSIS — E785 Hyperlipidemia, unspecified: Secondary | ICD-10-CM | POA: Diagnosis not present

## 2018-12-18 DIAGNOSIS — R7989 Other specified abnormal findings of blood chemistry: Secondary | ICD-10-CM | POA: Diagnosis not present

## 2018-12-18 DIAGNOSIS — Z860100 Personal history of colon polyps, unspecified: Secondary | ICD-10-CM

## 2018-12-18 MED ORDER — TESTOSTERONE CYPIONATE 150 MG/ML IJ SOLN: 150.0000 mg | INTRAMUSCULAR | 3 refills | Status: DC

## 2018-12-18 MED ORDER — TADALAFIL 20 MG PO TABS: ORAL_TABLET | ORAL | 6 refills | Status: DC

## 2018-12-18 NOTE — Progress Notes (Signed)
Virtual Visit via Telephone Note Due to current restrictions/limitations of in-office visits due to the COVID-19 pandemic, this scheduled clinical appointment was converted to a telehealth visit  I connected with Albert Martin on 12/18/18 at 11:42 p.m by telephone and verified that I am speaking with the correct person using two identifiers. I am in my office.  The patient is in his car, he has pulled over to talk  Only the patient and myself participated in this encounter.  I discussed the limitations, risks, security and privacy concerns of performing an evaluation and management service by telephone and the availability of in person appointments. I also discussed with the patient that there may be a patient responsible charge related to this service. The patient expressed understanding and agreed to proceed.  History of Present Illness: Patient with history of HTN, HL, ED, low Testosterone, CTS, OA RT shoulder/knee  Saw Dr. Havery Moros and had c-scope. 15 polys removed.  Pathology results are pending  OA of the right shoulder/knee: On last visit we had placed him on meloxicam.  Patient sent me a message in the middle of last month requesting a higher dose.  He was already on the highest dose of 15 mg.  I recommend adding low-dose Cymbalta which he was agreeable to.  Patient reports that he is doing well on Cymbalta.  He has had some anxiety about not working for the past 4 months and found the Cymbalta also to be helpful with relaxing him at night when he takes it.  Low Testosterone: Testosterone level was 345 when it was checked back in May.   -he takes testosterone inj Q 2-3 weeks.  PSA nl.  LDL 138.  H&H elevated at 18.3/54.9; previously 14.5/44.8 9 mths prior.  I had sent patient a lab letter informing him of these results.  He did receive the letter.  I had recommended that we probably need to decrease the dose of the injections given the elevation in H&H and possible referral to  endocrinology to help with management. -Patient is still on the 200 mg dose of Testosterone Cypionate. -He does not smoke. -He denies loud snoring.  His wife has never told him that he snores loud.  He reports restorative sleep.  No daytime sleepiness.  HTN: Reports compliance with amlodipine and hydrochlorothiazide.  He checks blood pressure daily.  Gives range of 125-130/75-80.  Bottom number never gets above 90.  Limit salt in the foods.  Denies chest pains, shortness of breath, lower extremity edema, chronic headaches.  HL: Cholesterol was not at goal.  I recommended increasing the Crestor to 20 mg daily.  He has done so.  ED: Doing well on Cialis.  Requests refills.   Outpatient Encounter Medications as of 12/18/2018  Medication Sig Note  . amLODipine (NORVASC) 10 MG tablet Take 1 tablet (10 mg total) by mouth daily.   . DULoxetine (CYMBALTA) 20 MG capsule Take 1 capsule (20 mg total) by mouth daily.   . hydrochlorothiazide (HYDRODIURIL) 25 MG tablet Take 25 mg by mouth daily. 02/11/2015: Received from: [facility]: Take 25 mg by mouth.  . meloxicam (MOBIC) 15 MG tablet Take 1 tablet (15 mg total) by mouth daily.   . rosuvastatin (CRESTOR) 20 MG tablet Take 1 tablet (20 mg total) by mouth daily.   . tadalafil (ADCIRCA/CIALIS) 20 MG tablet 1 tab 1/2 hr prior to intercourse PRN   . testosterone cypionate (DEPOTESTOSTERONE CYPIONATE) 200 MG/ML injection  02/11/2015: Received from: External Pharmacy  Facility-Administered Encounter Medications as of 12/18/2018  Medication  . 0.9 %  sodium chloride infusion      Observations/Objective: Results for orders placed or performed in visit on 09/18/18  Testosterone  Result Value Ref Range   Testosterone 345 264 - 916 ng/dL  PSA  Result Value Ref Range   Prostate Specific Ag, Serum 0.8 0.0 - 4.0 ng/mL  Lipid panel  Result Value Ref Range   Cholesterol, Total 215 (H) 100 - 199 mg/dL   Triglycerides 180 (H) 0 - 149 mg/dL   HDL  41 >39 mg/dL   VLDL Cholesterol Cal 36 5 - 40 mg/dL   LDL Calculated 138 (H) 0 - 99 mg/dL   Chol/HDL Ratio 5.2 (H) 0.0 - 5.0 ratio  Comprehensive metabolic panel  Result Value Ref Range   Glucose 105 (H) 65 - 99 mg/dL   BUN 17 8 - 27 mg/dL   Creatinine, Ser 1.26 0.76 - 1.27 mg/dL   GFR calc non Af Amer 57 (L) >59 mL/min/1.73   GFR calc Af Amer 66 >59 mL/min/1.73   BUN/Creatinine Ratio 13 10 - 24   Sodium 140 134 - 144 mmol/L   Potassium 4.2 3.5 - 5.2 mmol/L   Chloride 100 96 - 106 mmol/L   CO2 20 20 - 29 mmol/L   Calcium 9.9 8.6 - 10.2 mg/dL   Total Protein 7.8 6.0 - 8.5 g/dL   Albumin 4.7 3.8 - 4.8 g/dL   Globulin, Total 3.1 1.5 - 4.5 g/dL   Albumin/Globulin Ratio 1.5 1.2 - 2.2   Bilirubin Total 1.2 0.0 - 1.2 mg/dL   Alkaline Phosphatase 86 39 - 117 IU/L   AST 15 0 - 40 IU/L   ALT 24 0 - 44 IU/L  CBC  Result Value Ref Range   WBC 8.0 3.4 - 10.8 x10E3/uL   RBC 6.50 (H) 4.14 - 5.80 x10E6/uL   Hemoglobin 18.3 (H) 13.0 - 17.7 g/dL   Hematocrit 54.9 (H) 37.5 - 51.0 %   MCV 85 79 - 97 fL   MCH 28.2 26.6 - 33.0 pg   MCHC 33.3 31.5 - 35.7 g/dL   RDW 11.9 11.6 - 15.4 %   Platelets 239 150 - 450 x10E3/uL     Assessment and Plan: 1. Essential hypertension Reported blood pressure readings are at goal.  He will continue amlodipine and HCTZ  2. Hyperlipidemia, unspecified hyperlipidemia type LDL was not at goal.  Crestor has been increased to 20 mg daily.  We will plan to recheck cholesterol on a follow-up visit.  Encouraged him to continue healthy eating habits and regular exercise  3. Erectile dysfunction, unspecified erectile dysfunction type - tadalafil (CIALIS) 20 MG tablet; 1 tab 1/2 hr prior to intercourse PRN  Dispense: 10 tablet; Refill: 6  4. Low testosterone level in male Given elevation in red blood cell mass and H&H, I recommend decreasing testosterone 250 mg every 2 weeks.  We will recheck testosterone level on follow-up visit along with a CBC - Testosterone  Cypionate 150 MG/ML SOLN; Inject 150 mg as directed every 14 (fourteen) days.  Dispense: 5 mL; Refill: 3  5. Erythrocytosis See #4 above  6. History of colon polyps Followed by GI   Follow Up Instructions: Follow-up visit in 3 months   I discussed the assessment and treatment plan with the patient. The patient was provided an opportunity to ask questions and all were answered. The patient agreed with the plan and demonstrated an understanding of the instructions.  The patient was advised to call back or seek an in-person evaluation if the symptoms worsen or if the condition fails to improve as anticipated.  I provided 16 minutes of non-face-to-face time during this encounter.   Karle Plumber, MD

## 2018-12-19 ENCOUNTER — Telehealth: Payer: Self-pay

## 2018-12-19 NOTE — Telephone Encounter (Signed)
Left message on answering machine. 

## 2018-12-19 NOTE — Telephone Encounter (Signed)
Open in error

## 2018-12-22 ENCOUNTER — Encounter: Payer: Self-pay | Admitting: Internal Medicine

## 2019-02-16 ENCOUNTER — Other Ambulatory Visit: Payer: Self-pay | Admitting: *Deleted

## 2019-02-16 MED ORDER — HYDROCHLOROTHIAZIDE 25 MG PO TABS: 25.0000 mg | ORAL_TABLET | Freq: Every day | ORAL | 2 refills | Status: DC

## 2019-05-07 ENCOUNTER — Other Ambulatory Visit: Payer: Self-pay | Admitting: Pharmacist

## 2019-05-07 MED ORDER — MELOXICAM 15 MG PO TABS: 15.0000 mg | ORAL_TABLET | Freq: Every day | ORAL | 2 refills | Status: DC

## 2019-05-07 MED ORDER — DULOXETINE HCL 20 MG PO CPEP: 20.0000 mg | ORAL_CAPSULE | Freq: Every day | ORAL | 2 refills | Status: DC

## 2019-06-08 ENCOUNTER — Other Ambulatory Visit: Payer: Self-pay

## 2019-06-08 ENCOUNTER — Ambulatory Visit (INDEPENDENT_AMBULATORY_CARE_PROVIDER_SITE_OTHER)
Admission: EM | Admit: 2019-06-08 | Discharge: 2019-06-08 | Disposition: A | Discharge status: Home / Self Care | Payer: Medicare Other | Source: Home / Self Care

## 2019-06-08 ENCOUNTER — Ambulatory Visit: Payer: Medicare Other | Admitting: Internal Medicine

## 2019-06-08 ENCOUNTER — Encounter (HOSPITAL_COMMUNITY): Payer: Self-pay | Admitting: Emergency Medicine

## 2019-06-08 ENCOUNTER — Emergency Department (HOSPITAL_COMMUNITY): Payer: Medicare Other

## 2019-06-08 ENCOUNTER — Emergency Department (HOSPITAL_COMMUNITY)
Admission: EM | Admit: 2019-06-08 | Discharge: 2019-06-08 | Disposition: A | Discharge status: Home / Self Care | Payer: Medicare Other | Attending: Emergency Medicine | Admitting: Emergency Medicine

## 2019-06-08 DIAGNOSIS — Z79899 Other long term (current) drug therapy: Secondary | ICD-10-CM | POA: Diagnosis not present

## 2019-06-08 DIAGNOSIS — Z87891 Personal history of nicotine dependence: Secondary | ICD-10-CM | POA: Insufficient documentation

## 2019-06-08 DIAGNOSIS — I10 Essential (primary) hypertension: Secondary | ICD-10-CM | POA: Diagnosis not present

## 2019-06-08 DIAGNOSIS — R079 Chest pain, unspecified: Secondary | ICD-10-CM

## 2019-06-08 LAB — CBC
HCT: 45.4 % (ref 39.0–52.0)
Hemoglobin: 14.6 g/dL (ref 13.0–17.0)
MCH: 27.8 pg (ref 26.0–34.0)
MCHC: 32.2 g/dL (ref 30.0–36.0)
MCV: 86.3 fL (ref 80.0–100.0)
Platelets: 237 10*3/uL (ref 150–400)
RBC: 5.26 MIL/uL (ref 4.22–5.81)
RDW: 12.7 % (ref 11.5–15.5)
WBC: 8.5 10*3/uL (ref 4.0–10.5)
nRBC: 0 % (ref 0.0–0.2)

## 2019-06-08 LAB — BASIC METABOLIC PANEL
Anion gap: 13 (ref 5–15)
BUN: 20 mg/dL (ref 8–23)
CO2: 22 mmol/L (ref 22–32)
Calcium: 9.3 mg/dL (ref 8.9–10.3)
Chloride: 102 mmol/L (ref 98–111)
Creatinine, Ser: 1.13 mg/dL (ref 0.61–1.24)
GFR calc Af Amer: >60 mL/min (ref >60)
GFR calc non Af Amer: >60 mL/min (ref >60)
Glucose, Bld: 100 mg/dL — ABNORMAL HIGH (ref 70–99)
Potassium: 3.8 mmol/L (ref 3.5–5.1)
Sodium: 137 mmol/L (ref 135–145)

## 2019-06-08 LAB — TROPONIN I (HIGH SENSITIVITY)
Troponin I (High Sensitivity): 4 ng/L (ref <18)
Troponin I (High Sensitivity): 4 ng/L (ref <18)

## 2019-06-08 NOTE — Discharge Instructions (Addendum)
Follow up with your primary care doctor as we discussed, return to the ED for recurrent symptoms

## 2019-06-08 NOTE — ED Notes (Signed)
Pt verbalized understanding of discharge paperwork and follow-up care.  °

## 2019-06-08 NOTE — ED Triage Notes (Signed)
Pt states hes had CP off and on for the last two weeks, states he thought it was initially a pulled muscle but its been persistent. Also states he has a "weird feeling in my left arm" yesterday, but denies today. States it was a "stinging feeling". Pt c/o soreness, ache chest.

## 2019-06-08 NOTE — ED Provider Notes (Addendum)
Pt evaluated in triage and based on symptoms and risk factors sending down to the ER for further evaluation.  Pt agreed.   EKG with normal sinus rhythm and normal rate.    Orvan July, NP 06/08/19 1024    Loura Halt A, NP 06/19/19 1226

## 2019-06-08 NOTE — ED Triage Notes (Signed)
Pt endorses left sided chest pain that radiates down left arm for a few weeks. Denies any SOB, dizziness. Worse with movement.

## 2019-06-08 NOTE — Discharge Instructions (Addendum)
Go to the ER for further evaluation

## 2019-06-08 NOTE — ED Provider Notes (Signed)
[facility] EMERGENCY DEPARTMENT Provider Note   CSN: [id number] Arrival date & time: 06/08/19  1032     History Chief Complaint  Patient presents with  . Chest Pain    Albert Martin is a 69 y.o. male.  HPI  HPI: A 69 year old patient with a history of hypertension and hypercholesterolemia presents for evaluation of chest pain. Initial onset of pain was less than one hour ago. The patient's chest pain is well-localized, is described as heaviness/pressure/tightness and is not worse with exertion. The patient's chest pain is middle- or left-sided, is not sharp and does not radiate to the arms/jaw/neck. The patient does not complain of nausea and denies diaphoresis. The patient has no history of stroke, has no history of peripheral artery disease, has not smoked in the past 90 days, denies any history of treated diabetes, has no relevant family history of coronary artery disease (first degree relative at less than age 82) and does not have an elevated BMI (>=30). Patient presents emergency room for evaluation of chest pain.  Patient states he has been having symptoms on and off for the last couple of weeks.  It is on the left side of his chest.  He has noticed that sometimes when he moves his arm in certain positions the pain comes on and he has also noticed it with certain activity like walking.  The last episode occurred when he was walking to the bedside.  He talked to his doctor about it who suggested to go to the urgent care in the urgent care sent him down to the ED.  Patient does not have a history of heart disease.  Past Medical History:  Diagnosis Date  . Arthritis   . Hyperlipidemia   . Hypertension     Patient Active Problem List   Diagnosis Date Noted  . Erythrocytosis 12/18/2018  . History of colon polyps 09/15/2018  . Low testosterone level in male 09/15/2018  . Erectile dysfunction 09/15/2018  . Primary osteoarthritis involving multiple joints  09/15/2018  . Hyperlipidemia 05/26/2007  . ERECTILE DYSFUNCTION 05/26/2007  . TOBACCO ABUSE 05/26/2007  . HEMATURIA UNSPECIFIED 05/26/2007  . OSTEOARTHRITIS 05/26/2007  . ARTHRITIS, SHOULDERS, BILATERAL 05/26/2007  . ARTHRITIS, BACK 05/26/2007  . Essential hypertension 07/05/2006    Past Surgical History:  Procedure Laterality Date  . WISDOM TOOTH EXTRACTION         Family History  Problem Relation Age of Onset  . Lung cancer Mother   . Hypertension Mother   . Arthritis Mother   . Esophageal cancer Mother   . Hypertension Father   . Arthritis Father   . Hypertension Brother   . Arthritis Brother   . Lung cancer Maternal Aunt   . Colon cancer Neg Hx   . Colon polyps Neg Hx   . Rectal cancer Neg Hx   . Stomach cancer Neg Hx     Social History   Tobacco Use  . Smoking status: Former Smoker    Packs/day: 0.50    Years: 10.00    Pack years: 5.00    Types: Cigarettes    Quit date: 08/15/2009    Years since quitting: 9.8  . Smokeless tobacco: Never Used  Substance Use Topics  . Alcohol use: No    Alcohol/week: 0.0 standard drinks  . Drug use: No    Home Medications Prior to Admission medications   Medication Sig Start Date End Date Taking? Authorizing Provider  amLODipine (NORVASC) 10 MG tablet Take 1  tablet (10 mg total) by mouth daily. 09/15/18   Ladell Pier, MD  DULoxetine (CYMBALTA) 20 MG capsule Take 1 capsule (20 mg total) by mouth daily. 05/07/19   Ladell Pier, MD  hydrochlorothiazide (HYDRODIURIL) 25 MG tablet Take 1 tablet (25 mg total) by mouth daily. 02/16/19   Ladell Pier, MD  meloxicam (MOBIC) 15 MG tablet Take 1 tablet (15 mg total) by mouth daily. 05/07/19   Ladell Pier, MD  rosuvastatin (CRESTOR) 20 MG tablet Take 1 tablet (20 mg total) by mouth daily. 09/22/18   Ladell Pier, MD  tadalafil (CIALIS) 20 MG tablet 1 tab 1/2 hr prior to intercourse PRN 12/18/18   Ladell Pier, MD  testosterone cypionate  (DEPOTESTOSTERONE CYPIONATE) 200 MG/ML injection  02/05/15   [provider]  Testosterone Cypionate 150 MG/ML SOLN Inject 150 mg as directed every 14 (fourteen) days. 12/18/18   Ladell Pier, MD    Allergies    Patient has no known allergies.  Review of Systems   Review of Systems  All other systems reviewed and are negative.   Physical Exam Updated Vital Signs BP 128/78   Pulse 63   Temp 97.9 F (36.6 C) (Oral)   Resp 12   Ht 1.829 m (6')   Wt 88.5 kg   SpO2 93%   BMI 26.45 kg/m   Physical Exam Vitals and nursing note reviewed.  Constitutional:      General: He is not in acute distress.    Appearance: He is well-developed.  HENT:     Head: Normocephalic and atraumatic.     Right Ear: External ear normal.     Left Ear: External ear normal.  Eyes:     General: No scleral icterus.       Right eye: No discharge.        Left eye: No discharge.     Conjunctiva/sclera: Conjunctivae normal.  Neck:     Trachea: No tracheal deviation.  Cardiovascular:     Rate and Rhythm: Normal rate and regular rhythm.  Pulmonary:     Effort: Pulmonary effort is normal. No respiratory distress.     Breath sounds: Normal breath sounds. No stridor. No wheezing or rales.  Abdominal:     General: Bowel sounds are normal. There is no distension.     Palpations: Abdomen is soft.     Tenderness: There is no abdominal tenderness. There is no guarding or rebound.  Musculoskeletal:        General: No tenderness.     Cervical back: Neck supple.  Skin:    General: Skin is warm and dry.     Findings: No rash.  Neurological:     Mental Status: He is alert.     Cranial Nerves: No cranial nerve deficit (no facial droop, extraocular movements intact, no slurred speech).     Sensory: No sensory deficit.     Motor: No abnormal muscle tone or seizure activity.     Coordination: Coordination normal.     ED Results / Procedures / Treatments   Labs (all labs ordered are listed, but  only abnormal results are displayed) Labs Reviewed  BASIC METABOLIC PANEL - Abnormal; Notable for the following components:      Result Value   Glucose, Bld 100 (*)    All other components within normal limits  CBC  TROPONIN I (HIGH SENSITIVITY)  TROPONIN I (HIGH SENSITIVITY)    EKG EKG Interpretation  Date/Time:  Friday  June 08 2019 10:35:48 EST Ventricular Rate:  70 PR Interval:  172 QRS Duration: 108 QT Interval:  422 QTC Calculation: 455 R Axis:   45 Text Interpretation: Normal sinus rhythm Normal ECG No significant change since last tracing Confirmed by Dorie Rank [phone]) on 06/08/2019 11:43:46 AM   Radiology DG Chest 2 View  Result Date: 06/08/2019 CLINICAL DATA:  Chest pain EXAM: CHEST - 2 VIEW COMPARISON:  2008 FINDINGS: Increased interstitial prominence. No pleural effusion or pneumothorax. Cardiomediastinal contours are within normal limits. There is no acute osseous abnormality. IMPRESSION: Increased interstitial prominence of unknown chronicity. This could be chronic or reflect mild interstitial edema or atypical pneumonia. Electronically Signed   By: Macy Mis M.D.   On: 06/08/2019 11:08    Procedures Procedures (including critical care time)  Medications Ordered in ED Medications - No data to display  ED Course  I have reviewed the triage vital signs and the nursing notes.  Pertinent labs & imaging results that were available during my care of the patient were reviewed by me and considered in my medical decision making (see chart for details).    MDM Rules/Calculators/A&P HEAR Score: 3                    Patient presented to the ED for evaluation of intermittent chest pain.  Patient's symptoms are atypical for acute coronary syndrome.  He has a low risk hear score and his serial troponins were normal.  I doubt acute coronary syndrome.  Symptoms not suggestive of pulmonary embolism or aortic dissection.  ED work-up is reassuring.  This possible  symptoms are musculoskeletal in nature.  I do recommend outpatient follow-up with his primary care doctor and we discussed possible further testing such as stress test.  Patient instructed return to the ED for recurrent symptoms. Final Clinical Impression(s) / ED Diagnoses Final diagnoses:  Chest pain, unspecified type    Rx / DC Orders ED Discharge Orders    None       Dorie Rank, MD 06/08/19 1355

## 2019-06-26 ENCOUNTER — Telehealth: Payer: Self-pay | Admitting: Internal Medicine

## 2019-06-26 MED ORDER — HYDROCHLOROTHIAZIDE 25 MG PO TABS: 25.0000 mg | ORAL_TABLET | Freq: Every day | ORAL | 0 refills | Status: DC

## 2019-06-26 NOTE — Telephone Encounter (Signed)
Rx sent 

## 2019-06-26 NOTE — Telephone Encounter (Signed)
1) Medication(s) Requested (by name): -hydrochlorothiazide (HYDRODIURIL) 25 MG tablet   2) Pharmacy of Choice: -[facility] - [place], [place] - [address]   3) Special Requests: Until appt on 07/13/2019

## 2019-07-07 ENCOUNTER — Ambulatory Visit: Payer: Medicare Other | Attending: Internal Medicine

## 2019-07-07 DIAGNOSIS — Z23 Encounter for immunization: Secondary | ICD-10-CM | POA: Insufficient documentation

## 2019-07-07 NOTE — Progress Notes (Signed)
   Covid-19 Vaccination Clinic  Name:  Albert Martin    MRN: [id number] DOB: 05-27-1950  07/07/2019  Albert Martin was observed post Covid-19 immunization for 15 minutes without incidence. He was provided with Vaccine Information Sheet and instruction to access the V-Safe system.   Albert Martin was instructed to call 911 with any severe reactions post vaccine: Marland Kitchen Difficulty breathing  . Swelling of your face and throat  . A fast heartbeat  . A bad rash all over your body  . Dizziness and weakness    Immunizations Administered    Name Date Dose VIS Date Route   Pfizer COVID-19 Vaccine 07/07/2019  8:26 AM 0.3 mL 04/27/2019 Intramuscular   Manufacturer: [place]   Lot: [id number]   [place]: [id number]

## 2019-07-13 ENCOUNTER — Ambulatory Visit: Payer: Medicare Other | Attending: Internal Medicine | Admitting: Internal Medicine

## 2019-07-13 ENCOUNTER — Encounter: Payer: Self-pay | Admitting: Internal Medicine

## 2019-07-13 ENCOUNTER — Other Ambulatory Visit: Payer: Self-pay

## 2019-07-13 VITALS — BP 119/79 | HR 72 | Temp 98.1°F | Resp 16 | Wt 199.6 lb

## 2019-07-13 DIAGNOSIS — M1991 Primary osteoarthritis, unspecified site: Secondary | ICD-10-CM | POA: Insufficient documentation

## 2019-07-13 DIAGNOSIS — Z7901 Long term (current) use of anticoagulants: Secondary | ICD-10-CM | POA: Diagnosis not present

## 2019-07-13 DIAGNOSIS — M8949 Other hypertrophic osteoarthropathy, multiple sites: Secondary | ICD-10-CM

## 2019-07-13 DIAGNOSIS — Z791 Long term (current) use of non-steroidal anti-inflammatories (NSAID): Secondary | ICD-10-CM | POA: Diagnosis not present

## 2019-07-13 DIAGNOSIS — E785 Hyperlipidemia, unspecified: Secondary | ICD-10-CM | POA: Insufficient documentation

## 2019-07-13 DIAGNOSIS — Z79899 Other long term (current) drug therapy: Secondary | ICD-10-CM | POA: Insufficient documentation

## 2019-07-13 DIAGNOSIS — Z87891 Personal history of nicotine dependence: Secondary | ICD-10-CM | POA: Insufficient documentation

## 2019-07-13 DIAGNOSIS — N529 Male erectile dysfunction, unspecified: Secondary | ICD-10-CM | POA: Insufficient documentation

## 2019-07-13 DIAGNOSIS — M159 Polyosteoarthritis, unspecified: Secondary | ICD-10-CM

## 2019-07-13 DIAGNOSIS — Z8249 Family history of ischemic heart disease and other diseases of the circulatory system: Secondary | ICD-10-CM | POA: Insufficient documentation

## 2019-07-13 DIAGNOSIS — R7989 Other specified abnormal findings of blood chemistry: Secondary | ICD-10-CM | POA: Diagnosis not present

## 2019-07-13 DIAGNOSIS — I10 Essential (primary) hypertension: Secondary | ICD-10-CM | POA: Diagnosis not present

## 2019-07-13 MED ORDER — HYDROCHLOROTHIAZIDE 25 MG PO TABS: 25.0000 mg | ORAL_TABLET | Freq: Every day | ORAL | 1 refills | Status: DC

## 2019-07-13 MED ORDER — ROSUVASTATIN CALCIUM 20 MG PO TABS: 20.0000 mg | ORAL_TABLET | Freq: Every day | ORAL | 6 refills | Status: DC

## 2019-07-13 MED ORDER — DULOXETINE HCL 20 MG PO CPEP: 20.0000 mg | ORAL_CAPSULE | Freq: Every day | ORAL | 6 refills | Status: DC

## 2019-07-13 MED ORDER — AMLODIPINE BESYLATE 10 MG PO TABS: 10.0000 mg | ORAL_TABLET | Freq: Every day | ORAL | 3 refills | Status: DC

## 2019-07-13 MED ORDER — MELOXICAM 15 MG PO TABS: 15.0000 mg | ORAL_TABLET | Freq: Every day | ORAL | 6 refills | Status: DC

## 2019-07-13 NOTE — Progress Notes (Signed)
Patient ID: Albert Martin, male    DOB: [birth year]  MRN: [id number]  CC: Hypertension   Subjective: Albert Martin is a 69 y.o. male who presents for chronic disease management.  Last seen 12/2018. His concerns today include:  Patient with history of HTN, HL, ED, low Testosterone, CTS, OA RT shoulder/knee, multiple colon polyps.  HYPERTENSION Currently taking: see medication list Med Adherence: [x]  Yes    []  No Medication side effects: []  Yes    [x]  No Adherence with salt restriction: [x]  Yes    []  No Home Monitoring?: [x]  Yes    []  No Monitoring Frequency: QOD Home BP results range: 125-130/70-80s SOB? []  Yes    [x]  No Chest Pain?: [x]  Yes -seen in ER 05/2019 with CP.  Work up was negative. Thinks it was MSK in nature.  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 Doing better but feels he needs a muscle relaxant to use PRN.  Works 6-7 days a wk.  Does wgh lifting at home once a wk Leg swelling?: []  Yes    [x]  No Headaches?: []  Yes    [x]  No Dizziness? []  Yes    [x]  No Comments:   HL:  Compliant with Crestor the last prescription was written with 6 refills which would have been done by the end of last year.  Low Testosterone: On last visit we will decrease the dose of testosterone injection from 250 to 150 mg once every 2 weeks due to increasing H/H.  He is taking the lower dose but states he takes it only once a month.  Recent CBC done on his visit to the emergency room reveals the hemoglobin and hematocrit have normalized.   OA: Reports that he is doing well on meloxicam and Cymbalta.  Requests refills.  Patient Active Problem List   Diagnosis Date Noted  . Erythrocytosis 12/18/2018  . History of colon polyps 09/15/2018  . Low testosterone level in male 09/15/2018  . Erectile dysfunction 09/15/2018  . Primary osteoarthritis involving multiple joints 09/15/2018  . Hyperlipidemia 05/26/2007  . ERECTILE DYSFUNCTION 05/26/2007  . TOBACCO ABUSE 05/26/2007  . HEMATURIA  UNSPECIFIED 05/26/2007  . OSTEOARTHRITIS 05/26/2007  . ARTHRITIS, SHOULDERS, BILATERAL 05/26/2007  . ARTHRITIS, BACK 05/26/2007  . Essential hypertension 07/05/2006     Current Outpatient Medications on File Prior to Visit  Medication Sig Dispense Refill  . tadalafil (CIALIS) 20 MG tablet 1 tab 1/2 hr prior to intercourse PRN 10 tablet 6  . Testosterone Cypionate 150 MG/ML SOLN Inject 150 mg as directed every 14 (fourteen) days. 5 mL 3   Current Facility-Administered Medications on File Prior to Visit  Medication Dose Route Frequency Provider Last Rate Last Admin  . 0.9 %  sodium chloride infusion  500 mL Intravenous Once Armbruster, Carlota Raspberry, MD        No Known Allergies  Social History   Socioeconomic History  . Marital status: Married    Spouse name: Not on file  . Number of children: 4  . Years of education: Not on file  . Highest education level: Not on file  Occupational History  . Not on file  Tobacco Use  . Smoking status: Former Smoker    Packs/day: 0.50    Years: 10.00    Pack years: 5.00    Types: Cigarettes    Quit date: 08/15/2009    Years since quitting: 9.9  . Smokeless tobacco: Never Used  Substance and Sexual Activity  . Alcohol  use: No    Alcohol/week: 0.0 standard drinks  . Drug use: No  . Sexual activity: Yes  Other Topics Concern  . Not on file  Social History Narrative  . Not on file   Social Determinants of Health   Financial Resource Strain:   . Difficulty of Paying Living Expenses: Not on file  Food Insecurity:   . Worried About Charity fundraiser in the Last Year: Not on file  . Ran Out of Food in the Last Year: Not on file  Transportation Needs:   . Lack of Transportation (Medical): Not on file  . Lack of Transportation (Non-Medical): Not on file  Physical Activity:   . Days of Exercise per Week: Not on file  . Minutes of Exercise per Session: Not on file  Stress:   . Feeling of Stress : Not on file  Social Connections:   .  Frequency of Communication with Friends and Family: Not on file  . Frequency of Social Gatherings with Friends and Family: Not on file  . Attends Religious Services: Not on file  . Active Member of Clubs or Organizations: Not on file  . Attends Archivist Meetings: Not on file  . Marital Status: Not on file  Intimate Partner Violence:   . Fear of Current or Ex-Partner: Not on file  . Emotionally Abused: Not on file  . Physically Abused: Not on file  . Sexually Abused: Not on file    Family History  Problem Relation Age of Onset  . Lung cancer Mother   . Hypertension Mother   . Arthritis Mother   . Esophageal cancer Mother   . Hypertension Father   . Arthritis Father   . Hypertension Brother   . Arthritis Brother   . Lung cancer Maternal Aunt   . Colon cancer Neg Hx   . Colon polyps Neg Hx   . Rectal cancer Neg Hx   . Stomach cancer Neg Hx     Past Surgical History:  Procedure Laterality Date  . WISDOM TOOTH EXTRACTION      ROS: Review of Systems Negative except as stated above  PHYSICAL EXAM: BP 119/79   Pulse 72   Temp 98.1 F (36.7 C)   Resp 16   Wt 199 lb 9.6 oz (90.5 kg)   SpO2 95%   BMI 27.07 kg/m   Wt Readings from Last 3 Encounters:  07/13/19 199 lb 9.6 oz (90.5 kg)  06/08/19 195 lb (88.5 kg)  12/15/18 208 lb (94.3 kg)    Physical Exam  General appearance - alert, well appearing, and in no distress Mental status - normal mood, behavior, speech, dress, motor activity, and thought processes Neck - supple, no significant adenopathy Chest - clear to auscultation, no wheezes, rales or rhonchi, symmetric air entry Heart - normal rate, regular rhythm, normal S1, S2, no murmurs, rubs, clicks or gallops Extremities - peripheral pulses normal, no pedal edema, no clubbing or cyanosis  CMP Latest Ref Rng & Units 06/08/2019 09/18/2018 07/01/2006  Glucose 70 - 99 mg/dL 100(H) 105(H) 119(H)  BUN 8 - 23 mg/dL 20 17 12   Creatinine 0.61 - 1.24 mg/dL 1.13  1.26 0.94  Sodium 135 - 145 mmol/L 137 140 141  Potassium 3.5 - 5.1 mmol/L 3.8 4.2 3.9  Chloride 98 - 111 mmol/L 102 100 105  CO2 22 - 32 mmol/L 22 20 22   Calcium 8.9 - 10.3 mg/dL 9.3 9.9 9.5  Total Protein 6.0 - 8.5 g/dL -  7.8 7.7  Total Bilirubin 0.0 - 1.2 mg/dL - 1.2 0.8  Alkaline Phos 39 - 117 IU/L - 86 106  AST 0 - 40 IU/L - 15 11  ALT 0 - 44 IU/L - 24 9   Lipid Panel     Component Value Date/Time   CHOL 215 (H) 09/18/2018 0939   TRIG 180 (H) 09/18/2018 0939   HDL 41 09/18/2018 0939   CHOLHDL 5.2 (H) 09/18/2018 0939   CHOLHDL 4.2 Ratio 07/01/2006 2201   VLDL 18 07/01/2006 2201   LDLCALC 138 (H) 09/18/2018 0939    CBC    Component Value Date/Time   WBC 8.5 06/08/2019 1041   RBC 5.26 06/08/2019 1041   HGB 14.6 06/08/2019 1041   HGB 18.3 (H) 09/18/2018 0939   HCT 45.4 06/08/2019 1041   HCT 54.9 (H) 09/18/2018 0939   PLT 237 06/08/2019 1041   PLT 239 09/18/2018 0939   MCV 86.3 06/08/2019 1041   MCV 85 09/18/2018 0939   MCH 27.8 06/08/2019 1041   MCHC 32.2 06/08/2019 1041   RDW 12.7 06/08/2019 1041   RDW 11.9 09/18/2018 0939   LYMPHSABS 2.4 07/01/2006 2201   MONOABS 0.7 07/01/2006 2201   EOSABS 0.3 07/01/2006 2201   BASOSABS 0.0 07/01/2006 2201    ASSESSMENT AND PLAN: 1. Essential hypertension At goal.  Continue hydrochlorothiazide and amlodipine. - hydrochlorothiazide (HYDRODIURIL) 25 MG tablet; Take 1 tablet (25 mg total) by mouth daily.  Dispense: 90 tablet; Refill: 1 - amLODipine (NORVASC) 10 MG tablet; Take 1 tablet (10 mg total) by mouth daily.  Dispense: 90 tablet; Refill: 3  2. Hyperlipidemia, unspecified hyperlipidemia type - Lipid panel - rosuvastatin (CRESTOR) 20 MG tablet; Take 1 tablet (20 mg total) by mouth daily.  Dispense: 30 tablet; Refill: 6  3. Low testosterone level in male We will check testosterone and PSA levels.  He will continue testosterone injection - Testosterone - PSA  4. Primary osteoarthritis involving multiple joints Doing  well on current medications - DULoxetine (CYMBALTA) 20 MG capsule; Take 1 capsule (20 mg total) by mouth daily.  Dispense: 30 capsule; Refill: 6 - meloxicam (MOBIC) 15 MG tablet; Take 1 tablet (15 mg total) by mouth daily.  Dispense: 30 tablet; Refill: 6  Tdap and flu vaccine deferred as patient just recently received the Covid 19 vaccine.  Patient was given the opportunity to ask questions.  Patient verbalized understanding of the plan and was able to repeat key elements of the plan.   Orders Placed This Encounter  Procedures  . Lipid panel  . Testosterone  . PSA     Requested Prescriptions   Signed Prescriptions Disp Refills  . hydrochlorothiazide (HYDRODIURIL) 25 MG tablet 90 tablet 1    Sig: Take 1 tablet (25 mg total) by mouth daily.  . rosuvastatin (CRESTOR) 20 MG tablet 30 tablet 6    Sig: Take 1 tablet (20 mg total) by mouth daily.  . DULoxetine (CYMBALTA) 20 MG capsule 30 capsule 6    Sig: Take 1 capsule (20 mg total) by mouth daily.  . meloxicam (MOBIC) 15 MG tablet 30 tablet 6    Sig: Take 1 tablet (15 mg total) by mouth daily.  Marland Kitchen amLODipine (NORVASC) 10 MG tablet 90 tablet 3    Sig: Take 1 tablet (10 mg total) by mouth daily.    Return in about 5 months (around 12/10/2019).  Karle Plumber, MD, FACP

## 2019-07-14 LAB — LIPID PANEL
Chol/HDL Ratio: 4 ratio (ref 0.0–5.0)
Cholesterol, Total: 178 mg/dL (ref 100–199)
HDL: 45 mg/dL (ref >39)
LDL Chol Calc (NIH): 116 mg/dL — ABNORMAL HIGH (ref 0–99)
Triglycerides: 92 mg/dL (ref 0–149)
VLDL Cholesterol Cal: 17 mg/dL (ref 5–40)

## 2019-07-14 LAB — TESTOSTERONE: Testosterone: 424 ng/dL (ref 264–916)

## 2019-07-14 LAB — PSA: Prostate Specific Ag, Serum: 0.9 ng/mL (ref 0.0–4.0)

## 2019-07-16 ENCOUNTER — Ambulatory Visit: Payer: Medicare Other

## 2019-07-17 ENCOUNTER — Telehealth: Payer: Self-pay

## 2019-07-17 NOTE — Telephone Encounter (Signed)
Contacted pt to go over lab results pt didn't answer left a detailed vm informing pt of results and if he has any questions or concerns to give me a call  

## 2019-07-31 ENCOUNTER — Ambulatory Visit: Payer: Medicare Other | Attending: Internal Medicine

## 2019-07-31 DIAGNOSIS — Z23 Encounter for immunization: Secondary | ICD-10-CM

## 2019-07-31 NOTE — Progress Notes (Signed)
   Covid-19 Vaccination Clinic  Name:  Albert Martin    MRN: [id number] DOB: 04/09/1951  07/31/2019  [name] was observed post Covid-19 immunization for 15 minutes without incident. He was provided with Vaccine Information Sheet and instruction to access the V-Safe system.   [name] was instructed to call 911 with any severe reactions post vaccine: Marland Kitchen Difficulty breathing  . Swelling of face and throat  . A fast heartbeat  . A bad rash all over body  . Dizziness and weakness   Immunizations Administered    Name Date Dose VIS Date Route   Pfizer COVID-19 Vaccine 07/31/2019  9:26 AM 0.3 mL 04/27/2019 Intramuscular   Manufacturer: [place]   Lot: [id number]   [place]: [id number]

## 2019-08-23 ENCOUNTER — Other Ambulatory Visit: Payer: Self-pay | Admitting: Internal Medicine

## 2019-08-23 DIAGNOSIS — N529 Male erectile dysfunction, unspecified: Secondary | ICD-10-CM

## 2019-10-29 ENCOUNTER — Ambulatory Visit: Payer: Medicare Other | Attending: Internal Medicine | Admitting: Internal Medicine

## 2019-10-29 ENCOUNTER — Other Ambulatory Visit: Payer: Self-pay

## 2019-10-29 DIAGNOSIS — Z0289 Encounter for other administrative examinations: Secondary | ICD-10-CM

## 2019-10-29 NOTE — Progress Notes (Signed)
Virtual Visit via Telephone Note Due to current restrictions/limitations of in-office visits due to the COVID-19 pandemic, this scheduled clinical appointment was converted to a telehealth visit  I connected with Albert Martin on 10/29/19 at 3:06 p.m by telephone and verified that I am speaking with the correct person using two identifiers. I am in my office.  The patient is at home.  Only the patient and myself participated in this encounter.  I discussed the limitations, risks, security and privacy concerns of performing an evaluation and management service by telephone and the availability of in person appointments. I also discussed with the patient that there may be a patient responsible charge related to this service. The patient expressed understanding and agreed to proceed.   History of Present Illness: Patient with history of HTN, HL, ED, low Testosterone, CTS,OA RT shoulder/knee, multiple colon polyps.  Pt requesting FMLA paperwork to be completed.  He is the caregiver for his wife who has several chronic medical conditions including congestive heart failure, CKD stage V, anemia of chronic disease, diabetes with complications.  She will be having a graft placed in her arm later this month in preparation for future dialysis.  Patient states that she does not drive so he does the driving, he sets of her medications, does most of the house chores and also helps with cooking.  He works third shift and 90% of the time he works 6 days a week.  He gets off at 7:00 in the mornings.  Sometimes she has appointments or procedures in the early morning where he has to leave work several hours early.  He is requesting to be off 2-3 times a month anywhere from 4 to 8 hours to help with her care.   Outpatient Encounter Medications as of 10/29/2019  Medication Sig  . amLODipine (NORVASC) 10 MG tablet Take 1 tablet (10 mg total) by mouth daily.  . DULoxetine (CYMBALTA) 20 MG capsule Take 1 capsule (20 mg  total) by mouth daily.  . hydrochlorothiazide (HYDRODIURIL) 25 MG tablet Take 1 tablet (25 mg total) by mouth daily.  . meloxicam (MOBIC) 15 MG tablet Take 1 tablet (15 mg total) by mouth daily.  . rosuvastatin (CRESTOR) 20 MG tablet Take 1 tablet (20 mg total) by mouth daily.  . tadalafil (CIALIS) 20 MG tablet Take 1 tablet by mouth 30 minutes before intercourse, as needed (max once daily)  . Testosterone Cypionate 150 MG/ML SOLN Inject 150 mg as directed every 14 (fourteen) days.   Facility-Administered Encounter Medications as of 10/29/2019  Medication  . 0.9 %  sodium chloride infusion      Observations/Objective: No direct observation done as this was a telephone encounter.  Assessment and Plan: 1. Encounter for completion of form with patient I will complete the FMLA form and will fax it per his request.  He has the fax number written on the form.   Follow Up Instructions: As previously scheduled for chronic disease management in August of this year.   I discussed the assessment and treatment plan with the patient. The patient was provided an opportunity to ask questions and all were answered. The patient agreed with the plan and demonstrated an understanding of the instructions.   The patient was advised to call back or seek an in-person evaluation if the symptoms worsen or if the condition fails to improve as anticipated.  I provided 10 minutes of non-face-to-face time during this encounter.   Karle Plumber, MD

## 2019-10-29 NOTE — Progress Notes (Signed)
Pt husband states that the FMLA is for his because he is her primary caregiver and he takes her to all his appointment

## 2019-12-17 ENCOUNTER — Ambulatory Visit: Payer: Medicare Other | Admitting: Internal Medicine

## 2020-01-14 ENCOUNTER — Other Ambulatory Visit: Payer: Self-pay | Admitting: Internal Medicine

## 2020-01-14 DIAGNOSIS — M159 Polyosteoarthritis, unspecified: Secondary | ICD-10-CM

## 2020-01-14 NOTE — Telephone Encounter (Signed)
Requested Prescriptions  Pending Prescriptions Disp Refills  . DULoxetine (CYMBALTA) 20 MG capsule [Pharmacy Med Name: duloxetine 20 mg capsule,delayed release] 90 capsule 1    Sig: TAKE 1 CAPSULE BY MOUTH DAILY     Psychiatry: Antidepressants - SNRI Passed - 01/14/2020  4:31 PM      Passed - Last BP in normal range    BP Readings from Last 1 Encounters:  07/13/19 119/79         Passed - Valid encounter within last 6 months    Recent Outpatient Visits          2 months ago Encounter for completion of form with patient   [place], MD   6 months ago Essential hypertension   [place], Deborah B, MD   1 year ago Essential hypertension   [place], Deborah B, MD   1 year ago Immunization due   [place], MD      Future Appointments            In 2 weeks Ladell Pier, MD [place]

## 2020-01-15 ENCOUNTER — Other Ambulatory Visit: Payer: Self-pay | Admitting: Internal Medicine

## 2020-01-15 DIAGNOSIS — M159 Polyosteoarthritis, unspecified: Secondary | ICD-10-CM

## 2020-01-15 MED ORDER — MELOXICAM 15 MG PO TABS: 15.0000 mg | ORAL_TABLET | Freq: Every day | ORAL | 6 refills | Status: DC

## 2020-01-31 ENCOUNTER — Ambulatory Visit: Payer: Medicare Other | Admitting: Internal Medicine

## 2020-04-09 ENCOUNTER — Other Ambulatory Visit: Payer: Self-pay

## 2020-04-09 ENCOUNTER — Encounter: Payer: Self-pay | Admitting: Physician Assistant

## 2020-04-09 ENCOUNTER — Ambulatory Visit: Payer: Medicare Other | Attending: Internal Medicine | Admitting: Physician Assistant

## 2020-04-09 VITALS — BP 132/76 | HR 76 | Wt 191.2 lb

## 2020-04-09 DIAGNOSIS — Z791 Long term (current) use of non-steroidal anti-inflammatories (NSAID): Secondary | ICD-10-CM | POA: Insufficient documentation

## 2020-04-09 DIAGNOSIS — K4090 Unilateral inguinal hernia, without obstruction or gangrene, not specified as recurrent: Secondary | ICD-10-CM | POA: Insufficient documentation

## 2020-04-09 DIAGNOSIS — I10 Essential (primary) hypertension: Secondary | ICD-10-CM | POA: Diagnosis not present

## 2020-04-09 DIAGNOSIS — Z79899 Other long term (current) drug therapy: Secondary | ICD-10-CM | POA: Diagnosis not present

## 2020-04-09 NOTE — Progress Notes (Signed)
Albert Martin, is a 69 y.o. male  WCB:[id number]  OHY:[id number]  DOB - Dec 23, 1950  Subjective:  Chief Complaint and HPI: Albert Martin is a 69 y.o. male here today for a R inguinal hernia on the R that is increasing in size.  No pain.  No hematuria.  No change in BM.  No weight loss.  Appetite is good.  He has had this for 5 years and it's no longer reducible.  PSA WNL < 1 year ago  ROS:   Constitutional:  No f/c, No night sweats, No unexplained weight loss. EENT:  No vision changes, No blurry vision, No hearing changes. No mouth, throat, or ear problems.  Respiratory: No cough, No SOB Cardiac: No CP, no palpitations GI:  No abd pain, No N/V/D. GU: No Urinary s/sx Musculoskeletal: No joint pain Neuro: No headache, no dizziness, no motor weakness.  Skin: No rash Endocrine:  No polydipsia. No polyuria.  Psych: Denies SI/HI  No problems updated.  ALLERGIES: No Known Allergies  PAST MEDICAL HISTORY: Past Medical History:  Diagnosis Date  . Arthritis   . Hyperlipidemia   . Hypertension     MEDICATIONS AT HOME: Prior to Admission medications   Medication Sig Start Date End Date Taking? Authorizing Provider  amLODipine (NORVASC) 10 MG tablet Take 1 tablet (10 mg total) by mouth daily. 07/13/19  Yes Ladell Pier, MD  DULoxetine (CYMBALTA) 20 MG capsule TAKE 1 CAPSULE BY MOUTH DAILY 01/14/20  Yes Ladell Pier, MD  hydrochlorothiazide (HYDRODIURIL) 25 MG tablet Take 1 tablet (25 mg total) by mouth daily. 07/13/19  Yes Ladell Pier, MD  meloxicam (MOBIC) 15 MG tablet Take 1 tablet (15 mg total) by mouth daily. 01/15/20  Yes Ladell Pier, MD  rosuvastatin (CRESTOR) 20 MG tablet Take 1 tablet (20 mg total) by mouth daily. 07/13/19  Yes Ladell Pier, MD  tadalafil (CIALIS) 20 MG tablet Take 1 tablet by mouth 30 minutes before intercourse, as needed (max once daily) 08/23/19  Yes Ladell Pier, MD  Testosterone Cypionate 150 MG/ML SOLN Inject 150 mg  as directed every 14 (fourteen) days. Patient not taking: Reported on 04/09/2020 12/18/18   Ladell Pier, MD     Objective:  EXAM:   Vitals:   04/09/20 1442  BP: 132/76  Pulse: 76  SpO2: 100%  Weight: 191 lb 3.2 oz (86.7 kg)    General appearance : A&OX3. NAD. Non-toxic-appearing HEENT: Atraumatic and Normocephalic.  PERRLA. EOM intact.  Chest/Lungs:  Breathing-non-labored, Good air entry bilaterally, breath sounds normal without rales, rhonchi, or wheezing  CVS: S1 S2 regular, no murmurs, gallops, rubs  R inguinal region-5X11 cm lump/mass that is not reducible, soft, well-circumscribed.  Non-tender Extremities: Bilateral Lower Ext shows no edema, both legs are warm to touch with = pulse throughout Neurology:  CN II-XII grossly intact, Non focal.   Psych:  TP linear. J/I WNL. Normal speech. Appropriate eye contact and affect.  Skin:  No Rash  Data Review Lab Results  Component Value Date   HGBA1C 5.1 07/04/2006     Assessment & Plan   1. Right inguinal hernia  Vs mass/lipoma? Not reducible.  No signs strangulation - Ambulatory referral to General Surgery   Patient have been counseled extensively about nutrition and exercise  Return in about 2 months (around 06/09/2020) for PCP;  htn and chronic conditions.  The patient was given clear instructions to go to ER or return to medical center if symptoms don't improve, worsen  or new problems develop. The patient verbalized understanding. The patient was told to call to get lab results if they haven't heard anything in the next week.     Freeman Caldron, PA-C [facility] and [place], [place]   04/09/2020, 3:56 PMPatient ID: Albert Martin, male   DOB: [birth year], 69 y.o.   MRN: [id number]

## 2020-04-09 NOTE — Progress Notes (Signed)
Hernia right side groin Had it for about 5 years

## 2020-04-09 NOTE — Patient Instructions (Signed)
° °Inguinal Hernia, Adult °An inguinal hernia is when fat or your intestines push through a weak spot in a muscle where your leg meets your lower belly (groin). This causes a rounded lump (bulge). This kind of hernia could also be: °· In your scrotum, if you are male. °· In folds of skin around your vagina, if you are male. °There are three types of inguinal hernias. These include: °· Hernias that can be pushed back into the belly (are reducible). This type rarely causes pain. °· Hernias that cannot be pushed back into the belly (are incarcerated). °· Hernias that cannot be pushed back into the belly and lose their blood supply (are strangulated). This type needs emergency surgery. °If you do not have symptoms, you may not need treatment. If you have symptoms or a large hernia, you may need surgery. °Follow these instructions at home: °Lifestyle °· Do these things if told by your doctor so you do not have trouble pooping (constipation): °? Drink enough fluid to keep your pee (urine) pale yellow. °? Eat foods that have a lot of fiber. These include fresh fruits and vegetables, whole grains, and beans. °? Limit foods that are high in fat and processed sugars. These include foods that are fried or sweet. °? Take medicine for trouble pooping. °· Avoid lifting heavy objects. °· Avoid standing for long amounts of time. °· Do not use any products that contain nicotine or tobacco. These include cigarettes and e-cigarettes. If you need help quitting, ask your doctor. °· Stay at a healthy weight. °General instructions °· You may try to push your hernia in by very gently pressing on it when you are lying down. Do not try to force the bulge back in if it will not push in easily. °· Watch your hernia for any changes in shape, size, or color. Tell your doctor if you see any changes. °· Take over-the-counter and prescription medicines only as told by your doctor. °· Keep all follow-up visits as told by your doctor. This is  important. °Contact a doctor if: °· You have a fever. °· You have new symptoms. °· Your symptoms get worse. °Get help right away if: °· The area where your leg meets your lower belly has: °? Pain that gets worse suddenly. °? A bulge that gets bigger suddenly, and it does not get smaller after that. °? A bulge that turns red or purple. °? A bulge that is painful when you touch it. °· You are a man, and your scrotum: °? Suddenly feels painful. °? Suddenly changes in size. °· You cannot push the hernia in by very gently pressing on it when you are lying down. Do not try to force the bulge back in if it will not push in easily. °· You feel sick to your stomach (nauseous), and that feeling does not go away. °· You throw up (vomit), and that keeps happening. °· You have a fast heartbeat. °· You cannot poop (have a bowel movement) or pass gas. °These symptoms may be an emergency. Do not wait to see if the symptoms will go away. Get medical help right away. Call your local emergency services (911 in the [place] °Summary °· An inguinal hernia is when fat or your intestines push through a weak spot in a muscle where your leg meets your lower belly (groin). This causes a rounded lump (bulge). °· If you do not have symptoms, you may not need treatment. If you have symptoms or a large hernia,   you may need surgery. °· Avoid lifting heavy objects. Also avoid standing for long amounts of time. °· Do not try to force the bulge back in if it will not push in easily. °This information is not intended to replace advice given to you by your health care provider. Make sure you discuss any questions you have with your health care provider. °Document Revised: 06/04/2017 Document Reviewed: 02/02/2017 °Elsevier Patient Education © 2020 [employer] ° °

## 2020-04-28 ENCOUNTER — Telehealth: Payer: Self-pay | Admitting: Internal Medicine

## 2020-04-28 NOTE — Telephone Encounter (Signed)
Patient came in to drop off FMLA Benefits form to be filled out by PCP

## 2020-04-28 NOTE — Telephone Encounter (Signed)
Will place in pcp fmla folder

## 2020-05-05 ENCOUNTER — Ambulatory Visit: Payer: Medicare Other | Attending: Internal Medicine | Admitting: Internal Medicine

## 2020-05-05 ENCOUNTER — Other Ambulatory Visit: Payer: Self-pay

## 2020-05-05 DIAGNOSIS — K4090 Unilateral inguinal hernia, without obstruction or gangrene, not specified as recurrent: Secondary | ICD-10-CM

## 2020-05-05 NOTE — Progress Notes (Signed)
Virtual Visit via Telephone Note  I connected with Albert Martin on 05/05/20 at 3:05 p.m by telephone and verified that I am speaking with the correct person using two identifiers.  Location: Patient: home Provider: office   I discussed the limitations, risks, security and privacy concerns of performing an evaluation and management service by telephone and the availability of in person appointments. I also discussed with the patient that there may be a patient responsible charge related to this service. The patient expressed understanding and agreed to proceed.   History of Present Illness: Patient with history of HTN, HL, ED, low Testosterone, CTS,OA RT shoulder/knee,multiple colon polyps.  Purpose of today's visit is to request FMLA form to be completed.  Patient was seen by a PA about a month ago and was diagnosed with right inguinal hernia versus lipoma.  He was referred to a Education officer, environmental.  He has appointment 05/23/2019. Since having this hernia, he has not been able to work consistently.  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  A crane is used to lift the pieces that can be up to 200 pounds but he still has to do some pushing and pulling.  The job requires him to lift at least 60 pounds and to stand for his 8-hour shift.  He did not inquire about light duty but states that some days when he goes in the may assign him different task if available but it is not guaranteed.  Patient reports pain in the right groin when he coughs and with certain movements.  It is about the size of a grapefruit.  He has been off work from December 5-15.  He started vacation December 16 until January 3 of 2022.  He is requesting FMLA intermittent leave of 10 to 12 days/month until he is seen by the surgeon.  Once he has had the surgery, he will need a new FMLA to cover him for the recovery.  Outpatient Encounter Medications as of 05/05/2020  Medication Sig  .  amLODipine (NORVASC) 10 MG tablet Take 1 tablet (10 mg total) by mouth daily.  . DULoxetine (CYMBALTA) 20 MG capsule TAKE 1 CAPSULE BY MOUTH DAILY  . hydrochlorothiazide (HYDRODIURIL) 25 MG tablet Take 1 tablet (25 mg total) by mouth daily.  . meloxicam (MOBIC) 15 MG tablet Take 1 tablet (15 mg total) by mouth daily.  . rosuvastatin (CRESTOR) 20 MG tablet Take 1 tablet (20 mg total) by mouth daily.  . tadalafil (CIALIS) 20 MG tablet Take 1 tablet by mouth 30 minutes before intercourse, as needed (max once daily)  . Testosterone Cypionate 150 MG/ML SOLN Inject 150 mg as directed every 14 (fourteen) days. (Patient not taking: Reported on 04/09/2020)   Facility-Administered Encounter Medications as of 05/05/2020  Medication  . 0.9 %  sodium chloride infusion      Observations/Objective: No direct observation done as this was a telephone encounter  Assessment and Plan: 1. Right inguinal hernia -Advised to avoid heavy lifting.  Knows to be seen emergently if he develops signs of strangulation. I will go ahead and complete FMLA form for him for intermittent leave until he sees the surgeon.   Follow Up Instructions: As needed   I discussed the assessment and treatment plan with the patient. The patient was provided an opportunity to ask questions and all were answered. The patient agreed with the plan and demonstrated an understanding of the instructions.   The patient was advised  to call back or seek an in-person evaluation if the symptoms worsen or if the condition fails to improve as anticipated.  I provided 10 minutes of non-face-to-face time during this encounter.   Karle Plumber, MD

## 2020-05-05 NOTE — Progress Notes (Signed)
Pt states he only has pain when he coughs

## 2020-05-08 DIAGNOSIS — Z0289 Encounter for other administrative examinations: Secondary | ICD-10-CM

## 2020-05-22 DIAGNOSIS — K4020 Bilateral inguinal hernia, without obstruction or gangrene, not specified as recurrent: Secondary | ICD-10-CM | POA: Diagnosis not present

## 2020-06-02 ENCOUNTER — Other Ambulatory Visit: Payer: Self-pay | Admitting: Internal Medicine

## 2020-06-02 DIAGNOSIS — I10 Essential (primary) hypertension: Secondary | ICD-10-CM

## 2020-06-02 DIAGNOSIS — E785 Hyperlipidemia, unspecified: Secondary | ICD-10-CM

## 2020-06-02 DIAGNOSIS — M159 Polyosteoarthritis, unspecified: Secondary | ICD-10-CM

## 2020-06-02 DIAGNOSIS — M8949 Other hypertrophic osteoarthropathy, multiple sites: Secondary | ICD-10-CM

## 2020-06-02 NOTE — Telephone Encounter (Signed)
Requested Prescriptions  Pending Prescriptions Disp Refills  . rosuvastatin (CRESTOR) 20 MG tablet [Pharmacy Med Name: rosuvastatin 20 mg tablet] 90 tablet 4    Sig: TAKE 1 TABLET BY MOUTH DAILY     Cardiovascular:  Antilipid - Statins Failed - 06/02/2020  1:08 PM      Failed - LDL in normal range and within 360 days    LDL Chol Calc (NIH)  Date Value Ref Range Status  07/13/2019 116 (H) 0 - 99 mg/dL Final         Passed - Total Cholesterol in normal range and within 360 days    Cholesterol, Total  Date Value Ref Range Status  07/13/2019 178 100 - 199 mg/dL Final         Passed - HDL in normal range and within 360 days    HDL  Date Value Ref Range Status  07/13/2019 45 >39 mg/dL Final         Passed - Triglycerides in normal range and within 360 days    Triglycerides  Date Value Ref Range Status  07/13/2019 92 0 - 149 mg/dL Final         Passed - Patient is not pregnant      Passed - Valid encounter within last 12 months    Recent Outpatient Visits          4 weeks ago Right inguinal hernia   [place], Neoma Laming B, MD   1 month ago Right inguinal hernia   [place], [place], [place]   7 months ago Encounter for completion of form with patient   [place], Dalbert Batman, MD   10 months ago Essential hypertension   [place] Ladell Pier, MD   1 year ago Essential hypertension   [place] Ladell Pier, MD             . hydrochlorothiazide (HYDRODIURIL) 25 MG tablet [Pharmacy Med Name: hydrochlorothiazide 25 mg tablet] 90 tablet 1    Sig: TAKE 1 TABLET BY MOUTH DAILY     Cardiovascular: Diuretics - Thiazide Passed - 06/02/2020  1:08 PM      Passed - Ca in normal range and within 360 days    Calcium  Date Value Ref Range Status  06/08/2019 9.3 8.9 - 10.3 mg/dL Final          Passed - Cr in normal range and within 360 days    Creatinine, Ser  Date Value Ref Range Status  06/08/2019 1.13 0.61 - 1.24 mg/dL Final         Passed - K in normal range and within 360 days    Potassium  Date Value Ref Range Status  06/08/2019 3.8 3.5 - 5.1 mmol/L Final         Passed - Na in normal range and within 360 days    Sodium  Date Value Ref Range Status  06/08/2019 137 135 - 145 mmol/L Final  09/18/2018 140 134 - 144 mmol/L Final         Passed - Last BP in normal range    BP Readings from Last 1 Encounters:  04/09/20 132/76         Passed - Valid encounter within last 6 months    Recent Outpatient Visits          4 weeks ago  Right inguinal hernia   [place], MD   1 month ago Right inguinal hernia   [place], [place]   7 months ago Encounter for completion of form with patient   [place], MD   10 months ago Essential hypertension   [place], MD   1 year ago Essential hypertension   [place], MD             Refused Prescriptions Disp Refills  . DULoxetine (CYMBALTA) 20 MG capsule [Pharmacy Med Name: duloxetine 20 mg capsule,delayed release] 90 capsule     Sig: TAKE 1 CAPSULE BY MOUTH DAILY     Psychiatry: Antidepressants - SNRI Passed - 06/02/2020  1:08 PM      Passed - Last BP in normal range    BP Readings from Last 1 Encounters:  04/09/20 132/76         Passed - Valid encounter within last 6 months    Recent Outpatient Visits          4 weeks ago Right inguinal hernia   [place], Deborah B, MD   1 month ago Right inguinal hernia   [place], [place], [place]   7 months ago Encounter for completion of form with  patient   [place], MD   10 months ago Essential hypertension   [place], Deborah B, MD   1 year ago Essential hypertension   [place] Ladell Pier, MD             . meloxicam (MOBIC) 15 MG tablet [Pharmacy Med Name: meloxicam 15 mg tablet] 90 tablet     Sig: TAKE 1 TABLET BY MOUTH EVERY DAY     Analgesics:  COX2 Inhibitors Passed - 06/02/2020  1:08 PM      Passed - HGB in normal range and within 360 days    Hemoglobin  Date Value Ref Range Status  06/08/2019 14.6 13.0 - 17.0 g/dL Final  09/18/2018 18.3 (H) 13.0 - 17.7 g/dL Final         Passed - Cr in normal range and within 360 days    Creatinine, Ser  Date Value Ref Range Status  06/08/2019 1.13 0.61 - 1.24 mg/dL Final         Passed - Patient is not pregnant      Passed - Valid encounter within last 12 months    Recent Outpatient Visits          4 weeks ago Right inguinal hernia   [place], Deborah B, MD   1 month ago Right inguinal hernia   [place], [place], [place]   7 months ago Encounter for completion of form with patient   [place] Ladell Pier, MD   10 months ago Essential hypertension   [place], Deborah B, MD   1 year ago Essential hypertension   [place] Ladell Pier, MD

## 2020-06-02 NOTE — Telephone Encounter (Signed)
Requested Prescriptions  Pending Prescriptions Disp Refills  . DULoxetine (CYMBALTA) 20 MG capsule [Pharmacy Med Name: duloxetine 20 mg capsule,delayed release] 90 capsule     Sig: TAKE 1 CAPSULE BY MOUTH DAILY     Psychiatry: Antidepressants - SNRI Passed - 06/02/2020  1:08 PM      Passed - Last BP in normal range    BP Readings from Last 1 Encounters:  04/09/20 132/76         Passed - Valid encounter within last 6 months    Recent Outpatient Visits          4 weeks ago Right inguinal hernia   [place], MD   1 month ago Right inguinal hernia   [place], Lev[place] Dy M, [place]   7 months ago Encounter for completion of form with patient   [place] Ladell Pier, MD   10 months ago Essential hypertension   [place] Ladell Pier, MD   1 year ago Essential hypertension   [place], Deborah B, MD             . rosuvastatin (CRESTOR) 20 MG tablet [Pharmacy Med Name: rosuvastatin 20 mg tablet] 90 tablet 4    Sig: TAKE 1 TABLET BY MOUTH DAILY     Cardiovascular:  Antilipid - Statins Failed - 06/02/2020  1:08 PM      Failed - LDL in normal range and within 360 days    LDL Chol Calc (NIH)  Date Value Ref Range Status  07/13/2019 116 (H) 0 - 99 mg/dL Final         Passed - Total Cholesterol in normal range and within 360 days    Cholesterol, Total  Date Value Ref Range Status  07/13/2019 178 100 - 199 mg/dL Final         Passed - HDL in normal range and within 360 days    HDL  Date Value Ref Range Status  07/13/2019 45 >39 mg/dL Final         Passed - Triglycerides in normal range and within 360 days    Triglycerides  Date Value Ref Range Status  07/13/2019 92 0 - 149 mg/dL Final         Passed - Patient is not pregnant      Passed - Valid encounter within last 12  months    Recent Outpatient Visits          4 weeks ago Right inguinal hernia   [place], Deborah B, MD   1 month ago Right inguinal hernia   [place], [place], [place]   7 months ago Encounter for completion of form with patient   [place] Ladell Pier, MD   10 months ago Essential hypertension   [place] Ladell Pier, MD   1 year ago Essential hypertension   [place], Deborah B, MD             . hydrochlorothiazide (HYDRODIURIL) 25 MG tablet [Pharmacy Med Name: hydrochlorothiazide 25 mg tablet] 90 tablet 1    Sig: TAKE 1 TABLET BY MOUTH DAILY     Cardiovascular: Diuretics - Thiazide Passed - 06/02/2020  1:08 PM  Passed - Ca in normal range and within 360 days    Calcium  Date Value Ref Range Status  06/08/2019 9.3 8.9 - 10.3 mg/dL Final         Passed - Cr in normal range and within 360 days    Creatinine, Ser  Date Value Ref Range Status  06/08/2019 1.13 0.61 - 1.24 mg/dL Final         Passed - K in normal range and within 360 days    Potassium  Date Value Ref Range Status  06/08/2019 3.8 3.5 - 5.1 mmol/L Final         Passed - Na in normal range and within 360 days    Sodium  Date Value Ref Range Status  06/08/2019 137 135 - 145 mmol/L Final  09/18/2018 140 134 - 144 mmol/L Final         Passed - Last BP in normal range    BP Readings from Last 1 Encounters:  04/09/20 132/76         Passed - Valid encounter within last 6 months    Recent Outpatient Visits          4 weeks ago Right inguinal hernia   [place], Deborah B, MD   1 month ago Right inguinal hernia   [place], [place], [place]   7 months ago Encounter for completion of form with patient   [place], MD   10 months ago Essential hypertension   [place] Ladell Pier, MD   1 year ago Essential hypertension   [place] Ladell Pier, MD             . meloxicam (MOBIC) 15 MG tablet [Pharmacy Med Name: meloxicam 15 mg tablet] 90 tablet     Sig: TAKE 1 TABLET BY MOUTH EVERY DAY     Analgesics:  COX2 Inhibitors Passed - 06/02/2020  1:08 PM      Passed - HGB in normal range and within 360 days    Hemoglobin  Date Value Ref Range Status  06/08/2019 14.6 13.0 - 17.0 g/dL Final  09/18/2018 18.3 (H) 13.0 - 17.7 g/dL Final         Passed - Cr in normal range and within 360 days    Creatinine, Ser  Date Value Ref Range Status  06/08/2019 1.13 0.61 - 1.24 mg/dL Final         Passed - Patient is not pregnant      Passed - Valid encounter within last 12 months    Recent Outpatient Visits          4 weeks ago Right inguinal hernia   [place], Deborah B, MD   1 month ago Right inguinal hernia   [place], [place], [place]   7 months ago Encounter for completion of form with patient   [place] Ladell Pier, MD   10 months ago Essential hypertension   [place], Deborah B, MD   1 year ago Essential hypertension   [place], Deborah B, MD

## 2020-06-16 ENCOUNTER — Other Ambulatory Visit: Payer: Self-pay | Admitting: Internal Medicine

## 2020-06-16 DIAGNOSIS — N529 Male erectile dysfunction, unspecified: Secondary | ICD-10-CM

## 2020-07-01 DIAGNOSIS — Z0289 Encounter for other administrative examinations: Secondary | ICD-10-CM

## 2020-08-11 ENCOUNTER — Other Ambulatory Visit: Payer: Self-pay | Admitting: Internal Medicine

## 2020-08-11 DIAGNOSIS — I10 Essential (primary) hypertension: Secondary | ICD-10-CM

## 2020-08-28 ENCOUNTER — Other Ambulatory Visit: Payer: Self-pay | Admitting: Internal Medicine

## 2020-08-28 DIAGNOSIS — M8949 Other hypertrophic osteoarthropathy, multiple sites: Secondary | ICD-10-CM

## 2020-08-28 DIAGNOSIS — M159 Polyosteoarthritis, unspecified: Secondary | ICD-10-CM

## 2020-09-27 ENCOUNTER — Other Ambulatory Visit: Payer: Self-pay | Admitting: Internal Medicine

## 2020-09-27 DIAGNOSIS — I10 Essential (primary) hypertension: Secondary | ICD-10-CM

## 2020-12-17 ENCOUNTER — Other Ambulatory Visit: Payer: Self-pay | Admitting: Internal Medicine

## 2020-12-17 DIAGNOSIS — I10 Essential (primary) hypertension: Secondary | ICD-10-CM

## 2020-12-17 DIAGNOSIS — M159 Polyosteoarthritis, unspecified: Secondary | ICD-10-CM

## 2020-12-17 DIAGNOSIS — M8949 Other hypertrophic osteoarthropathy, multiple sites: Secondary | ICD-10-CM

## 2021-01-30 ENCOUNTER — Encounter: Payer: Medicare Other | Admitting: Internal Medicine

## 2021-02-05 ENCOUNTER — Telehealth: Payer: Self-pay | Admitting: Internal Medicine

## 2021-02-05 NOTE — Telephone Encounter (Addendum)
Pt is calling back to rsch AWV

## 2021-02-05 NOTE — Telephone Encounter (Signed)
Copied from [place] [phone]. Topic: Appointment Scheduling - Scheduling Inquiry for Clinic >> Jan 30, 2021  9:34 AM Valere Dross wrote: Reason for CRM: Pt called in today to get is Welcome to [employer] appt for today(09/16) rescheduled and asked if a nurse could reach out to him to do so. Please advise   Called patient to get appointment rescheduled. No answer and vm not set up.

## 2021-02-10 ENCOUNTER — Other Ambulatory Visit: Payer: Self-pay | Admitting: Internal Medicine

## 2021-02-10 DIAGNOSIS — M8949 Other hypertrophic osteoarthropathy, multiple sites: Secondary | ICD-10-CM

## 2021-02-10 DIAGNOSIS — I10 Essential (primary) hypertension: Secondary | ICD-10-CM

## 2021-02-10 DIAGNOSIS — M159 Polyosteoarthritis, unspecified: Secondary | ICD-10-CM

## 2021-02-11 NOTE — Telephone Encounter (Signed)
Requested medications are due for refill today.  yes  Requested medications are on the active medications list.  yes  Last refill. Hydrochlorothiazide 12/17/2020, Amlodipine 08/11/2020, Duloxetine 12/17/2020  Future visit scheduled.   no  Notes to clinic.  Labs are expired. Pt last seen for these problems on 07/13/2019

## 2021-02-23 ENCOUNTER — Encounter: Payer: Self-pay | Admitting: Internal Medicine

## 2021-02-23 ENCOUNTER — Ambulatory Visit: Payer: Medicare (Managed Care) | Attending: Internal Medicine | Admitting: Internal Medicine

## 2021-02-23 VITALS — BP 128/81 | HR 89 | Resp 16 | Ht 70.0 in | Wt 170.0 lb

## 2021-02-23 DIAGNOSIS — R768 Other specified abnormal immunological findings in serum: Secondary | ICD-10-CM

## 2021-02-23 DIAGNOSIS — F17200 Nicotine dependence, unspecified, uncomplicated: Secondary | ICD-10-CM

## 2021-02-23 DIAGNOSIS — F17210 Nicotine dependence, cigarettes, uncomplicated: Secondary | ICD-10-CM

## 2021-02-23 DIAGNOSIS — F4321 Adjustment disorder with depressed mood: Secondary | ICD-10-CM | POA: Diagnosis not present

## 2021-02-23 DIAGNOSIS — Z7189 Other specified counseling: Secondary | ICD-10-CM

## 2021-02-23 DIAGNOSIS — R634 Abnormal weight loss: Secondary | ICD-10-CM

## 2021-02-23 DIAGNOSIS — Z125 Encounter for screening for malignant neoplasm of prostate: Secondary | ICD-10-CM

## 2021-02-23 DIAGNOSIS — E785 Hyperlipidemia, unspecified: Secondary | ICD-10-CM

## 2021-02-23 DIAGNOSIS — Z0000 Encounter for general adult medical examination without abnormal findings: Secondary | ICD-10-CM

## 2021-02-23 DIAGNOSIS — I10 Essential (primary) hypertension: Secondary | ICD-10-CM | POA: Diagnosis not present

## 2021-02-23 DIAGNOSIS — Z1159 Encounter for screening for other viral diseases: Secondary | ICD-10-CM

## 2021-02-23 DIAGNOSIS — Z0001 Encounter for general adult medical examination with abnormal findings: Secondary | ICD-10-CM

## 2021-02-23 DIAGNOSIS — M159 Polyosteoarthritis, unspecified: Secondary | ICD-10-CM

## 2021-02-23 MED ORDER — DULOXETINE HCL 20 MG PO CPEP: ORAL_CAPSULE | ORAL | 6 refills | Status: DC

## 2021-02-23 MED ORDER — MELOXICAM 15 MG PO TABS: 15.0000 mg | ORAL_TABLET | Freq: Every day | ORAL | 1 refills | Status: DC

## 2021-02-23 MED ORDER — HYDROCHLOROTHIAZIDE 25 MG PO TABS: 25.0000 mg | ORAL_TABLET | Freq: Every day | ORAL | 1 refills | Status: DC

## 2021-02-23 MED ORDER — AMLODIPINE BESYLATE 10 MG PO TABS: 10.0000 mg | ORAL_TABLET | Freq: Every day | ORAL | 6 refills | Status: DC

## 2021-02-23 MED ORDER — VARENICLINE TARTRATE 0.5 MG X 11 & 1 MG X 42 PO MISC: ORAL | 0 refills | Status: DC

## 2021-02-23 NOTE — Patient Instructions (Addendum)
If you execute a living will or healthcare power of attorney, please bring a copy of it for our records. Try to check your blood pressure at least once a week with goal being 130/80 or lower. Try to eat smaller but more frequent meals.  You can also purchase Ensure or boost shakes over-the-counter to supplement meals.  I will send prescription to your pharmacy for the generic Chantix for you to start taking.  Let me know if you feel he would benefit from some counseling for the grief reaction.

## 2021-02-23 NOTE — Progress Notes (Addendum)
Subjective:   Albert Martin is a 70 y.o. male who presents for an Initial Medicare Annual Wellness Visit. Patient with history of HTN, HL, ED, low Testosterone, CTS, OA RT shoulder/knee, multiple colon polyps.  Review of Systems    General: Patient reports he is loss about 21 to 22 pounds since the death of his wife in 02/05/2023 of last year.  Wife was a good cook and did most of the cooking.  Now he tries to do some cooking but most of it is quick meals and sandwich meat.  Reports poor appetite.  He works 6 to 7 days a week for [employer].  He is trying to get used to being alone.  Prior to her death, they were together for 20 years.  He has purchased a cat as a companion.  She reports good support from his stepdaughter who checks on him about once a week and also from his siblings.  His 3 siblings live in different states but they all call to check on him intermittently. -Denies any chronic cough, fever or night sweats.  He is passing his urine okay.  No blood in the urine.  He is moving his bowels okay without blood in the stools. -Started smoking 2 months after his wife died.  Prior to that he had stopped smoking for 10 years.  He tells me that he was always a light smoker and over his life span he has smoked on and off for about 10 years and never more than half a pack a day. [facility]: Reports compliance with Norvasc and hydrochlorothiazide.  Took medicines this morning but prior to that had missed taking his medicines for a few days.  He does have a blood pressure device but is not checking blood pressure regularly.  He tries to be mindful of salt in the foods.  No chest pains or shortness of breath.  Reports compliance with Crestor. [facility]: Requests refill on meloxicam and Cymbalta which works well for controlling his arthritis pains.        Objective:    Today's Vitals   02/23/21 1021 02/23/21 1118  BP: (!) 151/82 128/81  Pulse: 89   Resp: 16   SpO2: 97%   Weight: 170 lb  (77.1 kg)   Height: 5\' 10"  (1.778 m)    Body mass index is 24.39 kg/m. Wt Readings from Last 3 Encounters:  02/23/21 170 lb (77.1 kg)  04/09/20 191 lb 3.2 oz (86.7 kg)  07/13/19 199 lb 9.6 oz (90.5 kg)     Advanced Directives 02/23/2021 01/28/2015  Does Patient Have a Medical Advance Directive? No No  Would patient like information on creating a medical advance directive? Yes (Inpatient - patient defers creating a medical advance directive at this time - Information given) -    Current Medications (verified) Outpatient Encounter Medications as of 02/23/2021  Medication Sig   amLODipine (NORVASC) 10 MG tablet TAKE 1 TABLET BY MOUTH EVERY DAY   DULoxetine (CYMBALTA) 20 MG capsule TAKE 1 CAPSULE BY MOUTH EVERY DAY   hydrochlorothiazide (HYDRODIURIL) 25 MG tablet TAKE 1 TABLET BY MOUTH EVERY DAY   meloxicam (MOBIC) 15 MG tablet Take 1 tablet (15 mg total) by mouth daily. OFFICE VISIT NEEDED FOR ADDITIONAL REFILLS   rosuvastatin (CRESTOR) 20 MG tablet TAKE 1 TABLET BY MOUTH DAILY   tadalafil (CIALIS) 20 MG tablet Take 1 tablet by mouth 30 minutes before intercourse, as needed (max once daily)   Testosterone Cypionate 150 MG/ML SOLN Inject  150 mg as directed every 14 (fourteen) days. (Patient not taking: Reported on 04/09/2020)   Facility-Administered Encounter Medications as of 02/23/2021  Medication   0.9 %  sodium chloride infusion    Allergies (verified) Patient has no known allergies.   History: Past Medical History:  Diagnosis Date   Arthritis    Hyperlipidemia    Hypertension    Past Surgical History:  Procedure Laterality Date   WISDOM TOOTH EXTRACTION     Family History  Problem Relation Age of Onset   Lung cancer Mother    Hypertension Mother    Arthritis Mother    Esophageal cancer Mother    Hypertension Father    Arthritis Father    Hypertension Brother    Arthritis Brother    Lung cancer Maternal Aunt    Colon cancer Neg Hx    Colon polyps Neg Hx     Rectal cancer Neg Hx    Stomach cancer Neg Hx    Social History   Socioeconomic History   Marital status: Married    Spouse name: Not on file   Number of children: 4   Years of education: Not on file   Highest education level: Not on file  Occupational History   Not on file  Tobacco Use   Smoking status: Every Day    Packs/day: 0.25    Years: 10.00    Pack years: 2.50    Types: Cigarettes    Last attempt to quit: 08/15/2009    Years since quitting: 11.5   Smokeless tobacco: Never  Vaping Use   Vaping Use: Never used  Substance and Sexual Activity   Alcohol use: No    Alcohol/week: 0.0 standard drinks   Drug use: No   Sexual activity: Yes  Other Topics Concern   Not on file  Social History Narrative   Not on file   Social Determinants of Health   Financial Resource Strain: Not on file  Food Insecurity: Not on file  Transportation Needs: Not on file  Physical Activity: Not on file  Stress: Not on file  Social Connections: Not on file    Tobacco Counseling -started smoking again 2 mths after wife died.  He had quit for 10 yrs prior.  1 pk last 4 days.  Wants to quit again. Used Chantix last time with good results.  Had bad dreams initially with it but went away.   Clinical Intake:  Pain : No/denies pain NO  Diabetes: No Diabetic? No   Activities of Daily Living In your present state of health, do you have any difficulty performing the following activities: 02/23/2021 04/09/2020  Hearing? N N  Vision? N N  Difficulty concentrating or making decisions? N N  Walking or climbing stairs? N N  Dressing or bathing? N N  Doing errands, shopping? N N  Preparing Food and eating ? N -  Using the Toilet? N -  In the past six months, have you accidently leaked urine? N -  Do you have problems with loss of bowel control? N -  Managing your Medications? N -  Managing your Finances? N -  Housekeeping or managing your Housekeeping? N -  Some recent data might be  hidden    Patient Care Team: Ladell Pier, MD as PCP - General (Internal Medicine)  Indicate any recent Medical Services you may have received from other than [facility] providers in the past year (date may be approximate).     Assessment:  This is a routine wellness examination for Albert Martin.  Hearing/Vision screen Vision Screening   Right eye Left eye Both eyes  Without correction     With correction 20 40 20 30 20  40  Last eye exam was less than 6 mths ago at [facility].  Wearing new glasses.  Dietary issues and exercise activities discussed: Loss 21 lbs -eating a lot of lunch meat.   When he cooks, he tries to eat healthy - uses crock pot and bake most of his foods   Goals Addressed   None   Depression Screen PHQ 2/9 Scores 02/23/2021 04/09/2020 07/13/2019 09/15/2018  PHQ - 2 Score 0 0 0 0   Still grieving passing of his wife 01/2020. Now lives a lone.  He got a cat. His step-daughter checks on him every few wks.  He has a brother a [place], sister in [place] and brother in [place].  They have been supportive   Fall Risk Fall Risk  02/23/2021 05/05/2020 04/09/2020 07/13/2019  Falls in the past year? 0 0 0 0  Number falls in past yr: 0 0 0 -  Injury with Fall? 0 0 0 -  Risk for fall due to : No Fall Risks - - -  Had fall at work 2 wks ago when he accidentally tripped on an object.   FALL RISK PREVENTION PERTAINING TO THE HOME:  Any stairs in or around the home? Yes  If so, are there any without handrails? No  Home free of loose throw rugs in walkways, pet beds, electrical cords, etc? Yes  Adequate lighting in your home to reduce risk of falls? Yes   ASSISTIVE DEVICES UTILIZED TO PREVENT FALLS:  Life alert? No  Use of a cane, walker or w/c? No  Grab bars in the bathroom? Yes  Shower chair or bench in shower? No  Elevated toilet seat or a handicapped toilet? No   TIMED UP AND GO:  Was the test performed? Yes .  Length of time to ambulate 10 feet: 10 sec.   Gait slow and  steady without use of assistive device  Cognitive Function: MMSE - Mini Mental State Exam 02/23/2021  Orientation to time 5  Orientation to Place 5  Registration 3  Attention/ Calculation 5  Recall 3  Language- name 2 objects 2  Language- repeat 1  Language- follow 3 step command 3  Language- read & follow direction 1  Write a sentence 1  Copy design 0  Total score 29        Immunizations Immunization History  Administered Date(s) Administered   DTaP 09/18/2018   Influenza Whole 02/02/2007   Influenza-Unspecified 01/30/2021   PFIZER(Purple Top)SARS-COV-2 Vaccination 07/07/2019, 07/31/2019, 10/15/2020   PNEUMOCOCCAL CONJUGATE-20 10/15/2020   Pneumococcal Conjugate-13 09/18/2018   Td 05/18/1995, 10/15/2020   Tdap 10/15/2020   Zoster Recombinat (Shingrix) 10/15/2020, 01/30/2021    TDAP status: Up to date  Flu Vaccine status: Up to date  Pneumococcal vaccine status: Up to date  Covid-19 vaccine status: Completed vaccines  Qualifies for Shingles Vaccine? Yes   Zostavax completed No   Shingrix Completed?: Yes  Screening Tests Health Maintenance  Topic Date Due   Hepatitis C Screening  Never done   COVID-19 Vaccine (4 - Booster for Pfizer series) 02/14/2021   COLONOSCOPY (Pts 45-39yrs Insurance coverage will need to be confirmed)  12/14/2021   TETANUS/TDAP  10/16/2030   INFLUENZA VACCINE  Completed   Zoster Vaccines- Shingrix  Completed   HPV VACCINES  Aged  Out    Health Maintenance  Health Maintenance Due  Topic Date Due   Hepatitis C Screening  Never done   COVID-19 Vaccine (4 - Booster for Pfizer series) 02/14/2021    Colorectal cancer screening: Type of screening: Colonoscopy. Completed 11/2018. Repeat every 3 years  Lung Cancer Screening: (Low Dose CT Chest recommended if Age 43-80 years, 30 pack-year currently smoking OR have quit w/in 15years.) does not qualify.   Lung Cancer Screening Referral: NA  Additional Screening:  Hepatitis C Screening:  does qualify; Completed 02/23/21  Vision Screening: Recommended annual ophthalmology exams for early detection of glaucoma and other disorders of the eye. Is the patient up to date with their annual eye exam?  Yes  Who is the provider or what is the name of the office in which the patient attends annual eye exams? [facility] If pt is not established with a provider, would they like to be referred to a provider to establish care?  NA .   Dental Screening: Recommended annual dental exams for proper oral hygiene. Has dentures above and below.  Community Resource Referral / Chronic Care Management: CRR required this visit?  No   CCM required this visit?  No      Plan:    1. Encounter for Medicare annual wellness exam - 2. Advance directive discussed with patient Discussed advanced directive with patient including the meaning of healthcare power of attorney and living will.  Packet given.  Patient advised that should he execute an advanced directive, he should bring a copy for [place] to put on his record.  3. Weight loss, unintentional Most likely related to grief reaction and the fact that he he is not eating as much as he used to when his wife was still living and handled the cooking.  We will make sure that he is up-to-date on all age-appropriate cancer screenings.  Encouraged him to try to eat and cook healthier meals.  Use boost or Ensure to supplement meals. - TSH+T4F+T3Free  4. Feeling grief Patient feels that he is getting good and adequate support from his family even though they do not live in state  5. Essential hypertension Close to goal.  Continue current medications and low-salt diet - CBC - Comprehensive metabolic panel  6. Hyperlipidemia, unspecified hyperlipidemia type Continue Crestor.  He is due for lipid profile. - Lipid panel  7. Primary osteoarthritis involving multiple joints Continue meloxicam and Cymbalta  8. Tobacco dependence Pt is current  smoker. Patient advised to quit smoking. Discussed health risks associated with smoking including lung and other types of cancers, chronic lung diseases and CV risks.. Pt ready/not ready to give trail of quitting.   Discussed methods to help quit including quitting cold [place], use of NRT, Chantix and Bupropion.  Pt wanting to try: Chantix.  Advised that the medication can cause bad dreams and mood swings.  If he develops significant mood swings he should stop the medicine and let me know. _3_ Minutes spent on counseling. F/U: Reassess progress on subsequent visit  - varenicline (CHANTIX STARTING MONTH PAK) 0.5 MG X 11 & 1 MG X 42 tablet; Take one 0.5 mg tablet by mouth once daily for 3 days, then increase to one 0.5 mg tablet twice daily for 4 days, then increase to one 1 mg tablet twice daily.  Dispense: 53 tablet; Refill: 0  9. Need for hepatitis C screening test - HCV Ab w Reflex to Quant PCR  10. Prostate cancer screening - PSA  I have personally reviewed and noted the following in the patient's chart:   Medical and social history Use of alcohol, tobacco or illicit drugs  Current medications and supplements including opioid prescriptions. Patient is not currently taking opioid prescriptions. Functional ability and status Nutritional status Physical activity Advanced directives List of other physicians Hospitalizations, surgeries, and ER visits in previous 12 months Vitals Screenings to include cognitive, depression, and falls Referrals and appointments  In addition, I have reviewed and discussed with patient certain preventive protocols, quality metrics, and best practice recommendations. A written personalized care plan for preventive services as well as general preventive health recommendations were provided to patient.   Addendum 02/25/2021: Patient with positive hepatitis C antibody.  Please see telephone conversation that I had with patient today that is documented in the  chart.  We will add hepatitis B surface antigen and antibody and hepatitis A antibody to labs.  Karle Plumber, MD   02/23/2021

## 2021-02-25 ENCOUNTER — Telehealth: Payer: Self-pay | Admitting: Internal Medicine

## 2021-02-25 NOTE — Addendum Note (Signed)
Addended by: Karle Plumber B on: 02/25/2021 10:13 AM   Modules accepted: Orders

## 2021-02-25 NOTE — Telephone Encounter (Signed)
Phone call placed to patient this morning to review lab results.  Patient informed that screening test for hepatitis C was positive.  Will await quantitative level.  Patient reports being diagnosed with hepatitis C about 30 years ago and was treated.  He is not sure how he had contracted hepatitis C.  States he had a tattoo placed about 10 years ago..  Advised patient that if the quantitative level is positive, we will refer him to ID for retreatment.  Treatments available today are a lot more effective than what was available 20 or 30 years ago.  Advised to avoid sharing things like shavers and toothbrush that can potentially get his blood on it.  He does not think he was vaccinated for hepatitis a and B.  I will asked the lab to check hepatitis B antibody level and hepatitis A.  If negative, he is agreeable to starting the vaccine series. Thyroid level normal. Kidney and liver function test normal. PSA normal. LDL cholesterol is 131 with goal being less than 100.  Patient confirms that he has been taking Crestor 20 mg consistently.  I recommend increasing the dose to 1-1/2 tablets daily.  I will send an updated prescription to his pharmacy.  Results for orders placed or performed in visit on 02/23/21  PSA  Result Value Ref Range   Prostate Specific Ag, Serum 1.1 0.0 - 4.0 ng/mL  HCV Ab w Reflex to Quant PCR  Result Value Ref Range   HCV Ab >11.0 (H) 0.0 - 0.9 s/co ratio  CBC  Result Value Ref Range   WBC 9.9 3.4 - 10.8 x10E3/uL   RBC 5.23 4.14 - 5.80 x10E6/uL   Hemoglobin 14.4 13.0 - 17.7 g/dL   Hematocrit 43.8 37.5 - 51.0 %   MCV 84 79 - 97 fL   MCH 27.5 26.6 - 33.0 pg   MCHC 32.9 31.5 - 35.7 g/dL   RDW 12.1 11.6 - 15.4 %   Platelets 271 150 - 450 x10E3/uL  Comprehensive metabolic panel  Result Value Ref Range   Glucose 77 70 - 99 mg/dL   BUN 12 8 - 27 mg/dL   Creatinine, Ser 1.01 0.76 - 1.27 mg/dL   eGFR 79 >59 mL/min/1.73   BUN/Creatinine Ratio 12 10 - 24   Sodium 137 134 - 144  mmol/L   Potassium 3.9 3.5 - 5.2 mmol/L   Chloride 99 96 - 106 mmol/L   CO2 22 20 - 29 mmol/L   Calcium 9.7 8.6 - 10.2 mg/dL   Total Protein 7.3 6.0 - 8.5 g/dL   Albumin 4.3 3.7 - 4.7 g/dL   Globulin, Total 3.0 1.5 - 4.5 g/dL   Albumin/Globulin Ratio 1.4 1.2 - 2.2   Bilirubin Total 0.8 0.0 - 1.2 mg/dL   Alkaline Phosphatase 107 44 - 121 IU/L   AST 11 0 - 40 IU/L   ALT 9 0 - 44 IU/L  Lipid panel  Result Value Ref Range   Cholesterol, Total 200 (H) 100 - 199 mg/dL   Triglycerides 111 0 - 149 mg/dL   HDL 49 >39 mg/dL   VLDL Cholesterol Cal 20 5 - 40 mg/dL   LDL Chol Calc (NIH) 131 (H) 0 - 99 mg/dL   Chol/HDL Ratio 4.1 0.0 - 5.0 ratio  TSH+T4F+T3Free  Result Value Ref Range   TSH 1.100 0.450 - 4.500 uIU/mL   T3, Free 3.1 2.0 - 4.4 pg/mL   Free T4 1.10 0.82 - 1.77 ng/dL  HCV RT-PCR, Quant (Non-Graph)  Result Value Ref Range   Hepatitis C Quantitation WILL FOLLOW    HCV log10 WILL FOLLOW    Test Information (HCV): Comment    Interpretation (HCV): WILL FOLLOW

## 2021-02-25 NOTE — Telephone Encounter (Signed)
Pt called and is requesting to have a call back. He states that he is still has some questions on repeat labs, and if he needs to start taking medications not or after the second set of labs.  Please advise.

## 2021-02-26 LAB — CBC
Hematocrit: 43.8 % (ref 37.5–51.0)
Hemoglobin: 14.4 g/dL (ref 13.0–17.7)
MCH: 27.5 pg (ref 26.6–33.0)
MCHC: 32.9 g/dL (ref 31.5–35.7)
MCV: 84 fL (ref 79–97)
Platelets: 271 10*3/uL (ref 150–450)
RBC: 5.23 x10E6/uL (ref 4.14–5.80)
RDW: 12.1 % (ref 11.6–15.4)
WBC: 9.9 10*3/uL (ref 3.4–10.8)

## 2021-02-26 LAB — COMPREHENSIVE METABOLIC PANEL
ALT: 9 IU/L (ref 0–44)
AST: 11 IU/L (ref 0–40)
Albumin/Globulin Ratio: 1.4 (ref 1.2–2.2)
Albumin: 4.3 g/dL (ref 3.7–4.7)
Alkaline Phosphatase: 107 IU/L (ref 44–121)
BUN/Creatinine Ratio: 12 (ref 10–24)
BUN: 12 mg/dL (ref 8–27)
Bilirubin Total: 0.8 mg/dL (ref 0.0–1.2)
CO2: 22 mmol/L (ref 20–29)
Calcium: 9.7 mg/dL (ref 8.6–10.2)
Chloride: 99 mmol/L (ref 96–106)
Creatinine, Ser: 1.01 mg/dL (ref 0.76–1.27)
Globulin, Total: 3 g/dL (ref 1.5–4.5)
Glucose: 77 mg/dL (ref 70–99)
Potassium: 3.9 mmol/L (ref 3.5–5.2)
Sodium: 137 mmol/L (ref 134–144)
Total Protein: 7.3 g/dL (ref 6.0–8.5)
eGFR: 79 mL/min/{1.73_m2} (ref >59)

## 2021-02-26 LAB — LIPID PANEL
Chol/HDL Ratio: 4.1 ratio (ref 0.0–5.0)
Cholesterol, Total: 200 mg/dL — ABNORMAL HIGH (ref 100–199)
HDL: 49 mg/dL (ref >39)
LDL Chol Calc (NIH): 131 mg/dL — ABNORMAL HIGH (ref 0–99)
Triglycerides: 111 mg/dL (ref 0–149)
VLDL Cholesterol Cal: 20 mg/dL (ref 5–40)

## 2021-02-26 LAB — HCV RT-PCR, QUANT (NON-GRAPH): Hepatitis C Quantitation: NOT DETECTED IU/mL

## 2021-02-26 LAB — TSH+T4F+T3FREE
Free T4: 1.1 ng/dL (ref 0.82–1.77)
T3, Free: 3.1 pg/mL (ref 2.0–4.4)
TSH: 1.1 u[IU]/mL (ref 0.450–4.500)

## 2021-02-26 LAB — HCV AB W REFLEX TO QUANT PCR: HCV Ab: >11 s/co ratio — ABNORMAL HIGH (ref 0.0–0.9)

## 2021-02-26 LAB — PSA: Prostate Specific Ag, Serum: 1.1 ng/mL (ref 0.0–4.0)

## 2021-02-26 NOTE — Progress Notes (Signed)
Let patient know that follow-up test for hepatitis C reveals that he has been cured from it.

## 2021-03-03 ENCOUNTER — Telehealth: Payer: Self-pay

## 2021-03-03 NOTE — Telephone Encounter (Signed)
Contacted pt to go over lab results pt is aware and doesn't have any questions or concerns 

## 2021-03-19 LAB — HEPATITIS B SURFACE ANTIGEN: Hepatitis B Surface Ag: NEGATIVE

## 2021-03-19 LAB — SPECIMEN STATUS REPORT

## 2021-03-19 LAB — HEPATITIS B SURFACE ANTIBODY, QUANTITATIVE: Hepatitis B Surf Ab Quant: 30.2 m[IU]/mL (ref >9.9)

## 2021-03-19 LAB — HEPATITIS A ANTIBODY, TOTAL: hep A Total Ab: POSITIVE — AB

## 2021-05-29 ENCOUNTER — Telehealth: Payer: Self-pay | Admitting: Internal Medicine

## 2021-05-29 NOTE — Telephone Encounter (Signed)
Pt came in to off to get cortozone short.Advised that Dr goes not give. Pt request referral be sent for Ortho.

## 2021-05-29 NOTE — Telephone Encounter (Signed)
Why is pt needing a cortisone shot? Please find out because we will need to know for referral purpose

## 2021-06-09 ENCOUNTER — Encounter: Payer: Self-pay | Admitting: Internal Medicine

## 2021-06-09 ENCOUNTER — Ambulatory Visit: Payer: Medicare (Managed Care) | Admitting: Internal Medicine

## 2021-06-09 DIAGNOSIS — R7689 Other specified abnormal immunological findings in serum: Secondary | ICD-10-CM | POA: Insufficient documentation

## 2021-06-09 DIAGNOSIS — R768 Other specified abnormal immunological findings in serum: Secondary | ICD-10-CM | POA: Insufficient documentation

## 2021-06-15 ENCOUNTER — Telehealth: Payer: Self-pay | Admitting: Internal Medicine

## 2021-06-15 DIAGNOSIS — I10 Essential (primary) hypertension: Secondary | ICD-10-CM

## 2021-06-15 NOTE — Telephone Encounter (Signed)
Refilled 02/23/2021 #90 with 1 refill, enough to last until 09/03/21 Requested Prescriptions  Pending Prescriptions Disp Refills   hydrochlorothiazide (HYDRODIURIL) 25 MG tablet [Pharmacy Med Name: hydrochlorothiazide 25 mg tablet] 90 tablet 1    Sig: Take 1 tablet by mouth daily.     Cardiovascular: Diuretics - Thiazide Failed - 06/15/2021  5:36 PM      Failed - Valid encounter within last 6 months    Recent Outpatient Visits          3 months ago Encounter for Medicare annual wellness exam   [place], Deborah B, MD   1 year ago Right inguinal hernia   [place], Deborah B, MD   1 year ago Right inguinal hernia   [place], [place], [place]   1 year ago Encounter for completion of form with patient   [place] Ladell Pier, MD   1 year ago Essential hypertension   [place] Ladell Pier, MD             Passed - Ca in normal range and within 360 days    Calcium  Date Value Ref Range Status  02/23/2021 9.7 8.6 - 10.2 mg/dL Final         Passed - Cr in normal range and within 360 days    Creatinine, Ser  Date Value Ref Range Status  02/23/2021 1.01 0.76 - 1.27 mg/dL Final         Passed - K in normal range and within 360 days    Potassium  Date Value Ref Range Status  02/23/2021 3.9 3.5 - 5.2 mmol/L Final         Passed - Na in normal range and within 360 days    Sodium  Date Value Ref Range Status  02/23/2021 137 134 - 144 mmol/L Final         Passed - Last BP in normal range    BP Readings from Last 1 Encounters:  02/23/21 128/81

## 2021-06-16 NOTE — Telephone Encounter (Signed)
Pt missed last appointment will forward to provider

## 2021-06-17 MED ORDER — HYDROCHLOROTHIAZIDE 25 MG PO TABS: 25.0000 mg | ORAL_TABLET | Freq: Every day | ORAL | 1 refills | Status: DC

## 2021-06-17 NOTE — Addendum Note (Signed)
Addended by: Karle Plumber B on: 06/17/2021 09:15 AM   Modules accepted: Orders

## 2021-08-19 ENCOUNTER — Other Ambulatory Visit: Payer: Self-pay | Admitting: Internal Medicine

## 2021-08-19 DIAGNOSIS — M159 Polyosteoarthritis, unspecified: Secondary | ICD-10-CM

## 2021-08-27 ENCOUNTER — Ambulatory Visit (HOSPITAL_COMMUNITY)
Admission: EM | Admit: 2021-08-27 | Discharge: 2021-08-27 | Disposition: A | Discharge status: Home / Self Care | Payer: Medicare (Managed Care) | Attending: Emergency Medicine | Admitting: Emergency Medicine

## 2021-08-27 ENCOUNTER — Encounter (HOSPITAL_COMMUNITY): Payer: Self-pay

## 2021-08-27 DIAGNOSIS — J301 Allergic rhinitis due to pollen: Secondary | ICD-10-CM | POA: Diagnosis not present

## 2021-08-27 MED ORDER — PREDNISONE 50 MG PO TABS: ORAL_TABLET | ORAL | 0 refills | Status: DC

## 2021-08-27 MED ORDER — MOMETASONE FUROATE 50 MCG/ACT NA SUSP: 2.0000 | Freq: Every day | NASAL | 12 refills | Status: DC

## 2021-08-27 MED ORDER — ALBUTEROL SULFATE HFA 108 (90 BASE) MCG/ACT IN AERS: 1.0000 | INHALATION_SPRAY | Freq: Four times a day (QID) | RESPIRATORY_TRACT | 0 refills | Status: DC | PRN

## 2021-08-27 NOTE — ED Triage Notes (Signed)
Pt presents with a fever, cough, runny nose x 2 days.  ? ? ?

## 2021-08-27 NOTE — Discharge Instructions (Signed)
Use nasal steroid, prednisone and antihistamines such as Zyrtec or Claritin daily.  Your symptoms are consistent with allergic rhinitis.  Medications need to be used together.  Return if new or worsening symptoms regular fevers or colored discharge at any time for reevaluation. ?

## 2021-08-27 NOTE — ED Provider Notes (Signed)
?[place] ? ? ?MRN: [id number] DOB: October 30, 1950 ? ?Subjective:  ? ?Chief Complaint;  ?Chief Complaint  ?Patient presents with  ? Cough  ? Nasal Congestion  ?   ?  ? Fever  ? ?Pt presents with a fever, cough, runny nose x 2 days ? ?Albert Martin is a 71 y.o. male presenting for runny nose with cough for the last 2 to 3 days mostly clear no discoloration to the discharge.  Denies any significant fevers.  No history of allergies ? ? ?Current Facility-Administered Medications:  ?  0.9 %  sodium chloride infusion, 500 mL, Intravenous, Once, Armbruster, Carlota Raspberry, MD ? ?Current Outpatient Medications:  ?  albuterol (VENTOLIN HFA) 108 (90 Base) MCG/ACT inhaler, Inhale 1-2 puffs into the lungs every 6 (six) hours as needed for wheezing or shortness of breath., Disp: 18 g, Rfl: 0 ?  meloxicam (MOBIC) 15 MG tablet, TAKE 1 TABLET BY MOUTH EVERY DAY, Disp: 90 tablet, Rfl: 0 ?  mometasone (NASONEX) 50 MCG/ACT nasal spray, Place 2 sprays into the nose daily., Disp: 1 each, Rfl: 12 ?  predniSONE (DELTASONE) 50 MG tablet, Take 1 pill daily for 5 days as directed, Disp: 5 tablet, Rfl: 0 ?  amLODipine (NORVASC) 10 MG tablet, Take 1 tablet (10 mg total) by mouth daily., Disp: 30 tablet, Rfl: 6 ?  DULoxetine (CYMBALTA) 20 MG capsule, TAKE 1 CAPSULE BY MOUTH EVERY DAY, Disp: 30 capsule, Rfl: 6 ?  hydrochlorothiazide (HYDRODIURIL) 25 MG tablet, Take 1 tablet (25 mg total) by mouth daily., Disp: 90 tablet, Rfl: 1 ?  rosuvastatin (CRESTOR) 20 MG tablet, TAKE 1 TABLET BY MOUTH DAILY, Disp: 90 tablet, Rfl: 4 ?  tadalafil (CIALIS) 20 MG tablet, Take 1 tablet by mouth 30 minutes before intercourse, as needed (max once daily), Disp: 30 tablet, Rfl: 4 ?  Testosterone Cypionate 150 MG/ML SOLN, Inject 150 mg as directed every 14 (fourteen) days. (Patient not taking: Reported on 04/09/2020), Disp: 5 mL, Rfl: 3 ?  varenicline (CHANTIX STARTING MONTH PAK) 0.5 MG X 11 & 1 MG X 42 tablet, Take one 0.5 mg tablet by mouth once  daily for 3 days, then increase to one 0.5 mg tablet twice daily for 4 days, then increase to one 1 mg tablet twice daily., Disp: 53 tablet, Rfl: 0  ? ?No Known Allergies ? ?Past Medical History:  ?Diagnosis Date  ? Arthritis   ? Hyperlipidemia   ? Hypertension   ?  ? ?ROS ? ? ?Objective:  ? ?Vitals: ?BP 124/76 (BP Location: Left Arm)   Pulse 99   Temp 100 ?F (37.8 ?C) (Oral)   Resp 18   SpO2 94%  ? ?Physical Exam ?Vitals and nursing note reviewed.  ?Constitutional:   ?   General: He is not in acute distress. ?   Appearance: He is well-developed.  ?HENT:  ?   Head: Normocephalic and atraumatic.  ?   Right Ear: Tympanic membrane normal.  ?   Left Ear: Tympanic membrane normal.  ?   Nose: Congestion present.  ?   Comments: Clear congestion boggy turbinates ?Eyes:  ?   Conjunctiva/sclera: Conjunctivae normal.  ?Cardiovascular:  ?   Rate and Rhythm: Normal rate and regular rhythm.  ?   Heart sounds: No murmur heard. ?Pulmonary:  ?   Effort: Pulmonary effort is normal. No respiratory distress.  ?   Breath sounds: No stridor. Wheezing (Expiratory on strong cough) present. No rhonchi or rales.  ?Chest:  ?  Chest wall: No tenderness.  ?Abdominal:  ?   Palpations: Abdomen is soft.  ?   Tenderness: There is no abdominal tenderness.  ?Musculoskeletal:     ?   General: No swelling.  ?   Cervical back: Neck supple.  ?Skin: ?   General: Skin is warm and dry.  ?   Capillary Refill: Capillary refill takes less than 2 seconds.  ?Neurological:  ?   Mental Status: He is alert.  ?Psychiatric:     ?   Mood and Affect: Mood normal.  ? ? ?No results found for this or any previous visit (from the past 24 hour(s)). ? ?No results found.  ?  ? ?Assessment and Plan :  ? ?1. Seasonal allergic rhinitis due to pollen   ? ? ?Meds ordered this encounter  ?Medications  ? predniSONE (DELTASONE) 50 MG tablet  ?  Sig: Take 1 pill daily for 5 days as directed  ?  Dispense:  5 tablet  ?  Refill:  0  ? mometasone (NASONEX) 50 MCG/ACT nasal spray  ?   Sig: Place 2 sprays into the nose daily.  ?  Dispense:  1 each  ?  Refill:  12  ? albuterol (VENTOLIN HFA) 108 (90 Base) MCG/ACT inhaler  ?  Sig: Inhale 1-2 puffs into the lungs every 6 (six) hours as needed for wheezing or shortness of breath.  ?  Dispense:  18 g  ?  Refill:  0  ? ? ?MDM:  ?Albert Martin is a 71 y.o. male presenting for runny nose and congestion without significant fever or colored discharge.  Patient denies history of allergies but has been having a lot of runny nose and cough with wheeze.  His exam showed evidence of allergic rhinitis he did have an expiratory wheeze on strong cough.  His vital signs were unremarkable except mild elevation of temperature at 100 there was no sinus tenderness on exam or evidence of bacterial infection.  He was prescribed prednisone Nasonex and albuterol and given strict return instructions.  I discussed treatment, follow up and return instructions. Questions were answered. Patient stated understanding of instructions and is stable for discharge. ? ?Leida Lauth FNP-C MCN  ?  ?Hezzie Bump, NP ?08/27/21 1533 ? ?

## 2021-08-31 ENCOUNTER — Telehealth: Payer: Self-pay | Admitting: Internal Medicine

## 2021-08-31 NOTE — Telephone Encounter (Signed)
Pt is scheduled to see Cari on 4/19 ?

## 2021-08-31 NOTE — Telephone Encounter (Signed)
Pt stopped by wanting a medication refill for prednisone 50 mg , pt is advised to request this on appt 6/5 ?

## 2021-09-02 ENCOUNTER — Ambulatory Visit (INDEPENDENT_AMBULATORY_CARE_PROVIDER_SITE_OTHER): Payer: Medicare (Managed Care) | Admitting: Physician Assistant

## 2021-09-02 ENCOUNTER — Ambulatory Visit (INDEPENDENT_AMBULATORY_CARE_PROVIDER_SITE_OTHER): Payer: Medicare (Managed Care)

## 2021-09-02 ENCOUNTER — Encounter: Payer: Self-pay | Admitting: Physician Assistant

## 2021-09-02 VITALS — BP 123/78 | HR 86 | Temp 98.8°F | Resp 18 | Ht 71.0 in | Wt 161.0 lb

## 2021-09-02 DIAGNOSIS — R9389 Abnormal findings on diagnostic imaging of other specified body structures: Secondary | ICD-10-CM | POA: Diagnosis not present

## 2021-09-02 DIAGNOSIS — J208 Acute bronchitis due to other specified organisms: Secondary | ICD-10-CM

## 2021-09-02 DIAGNOSIS — B9689 Other specified bacterial agents as the cause of diseases classified elsewhere: Secondary | ICD-10-CM | POA: Diagnosis not present

## 2021-09-02 DIAGNOSIS — Z87891 Personal history of nicotine dependence: Secondary | ICD-10-CM

## 2021-09-02 DIAGNOSIS — F17200 Nicotine dependence, unspecified, uncomplicated: Secondary | ICD-10-CM

## 2021-09-02 MED ORDER — BENZONATATE 200 MG PO CAPS: 200.0000 mg | ORAL_CAPSULE | Freq: Two times a day (BID) | ORAL | 0 refills | Status: DC | PRN

## 2021-09-02 MED ORDER — AZITHROMYCIN 250 MG PO TABS: ORAL_TABLET | ORAL | 0 refills | Status: DC

## 2021-09-02 MED ORDER — PREDNISONE 20 MG PO TABS: ORAL_TABLET | ORAL | 0 refills | Status: AC

## 2021-09-02 NOTE — Patient Instructions (Signed)
You are going to take azithromycin as directed, you can use Tessalon Perles twice a day to help you with your cough, and you are also going to do a steroid taper.  I do encourage you to stay very well-hydrated, and get plenty of rest. ? ?We will call you with the results of your chest x-ray. ? ?I hope that you feel better soon ? ?Kennieth Rad, PA-C ?Physician Assistant ?[facility] ?[email or web address] ? ? ?Acute Bronchitis, Adult ? ?Acute bronchitis is sudden inflammation of the main airways (bronchi) that come off the windpipe (trachea) in the lungs. The swelling causes the airways to get smaller and make more mucus than normal. This can make it hard to breathe and can cause coughing or noisy breathing (wheezing). ?Acute bronchitis may last several weeks. The cough may last longer. Allergies, asthma, and exposure to smoke may make the condition worse. ?What are the causes? ?This condition can be caused by germs and by substances that irritate the lungs, including: ?Cold and flu viruses. The most common cause of this condition is the virus that causes the common cold. ?Bacteria. This is less common. ?Breathing in substances that irritate the lungs, including: ?Smoke from cigarettes and other forms of tobacco. ?Dust and pollen. ?Fumes from household cleaning products, gases, or burned fuel. ?Indoor or outdoor air pollution. ?What increases the risk? ?The following factors may make you more likely to develop this condition: ?A weak body's defense system, also called the immune system. ?A condition that affects your lungs and breathing, such as asthma. ?What are the signs or symptoms? ?Common symptoms of this condition include: ?Coughing. This may bring up clear, yellow, or green mucus from your lungs (sputum). ?Wheezing. ?Runny or stuffy nose. ?Having too much mucus in your lungs (chest congestion). ?Shortness of breath. ?Aches and pains, including sore throat  or chest. ?How is this diagnosed? ?This condition is usually diagnosed based on: ?Your symptoms and medical history. ?A physical exam. ?You may also have other tests, including tests to rule out other conditions, such as pneumonia. These tests include: ?A test of lung function. ?Test of a mucus sample to look for the presence of bacteria. ?Tests to check the oxygen level in your blood. ?Blood tests. ?Chest X-ray. ?How is this treated? ?Most cases of acute bronchitis clear up over time without treatment. Your health care provider may recommend: ?Drinking more fluids to help thin your mucus so it is easier to cough up. ?Taking inhaled medicine (inhaler) to improve air flow in and out of your lungs. ?Using a vaporizer or a humidifier. These are machines that add water to the air to help you breathe better. ?Taking a medicine that thins mucus and clears congestion (expectorant). ?Taking a medicine that prevents or stops coughing (cough suppressant). ?It is notcommon to take an antibiotic medicine for this condition. ?Follow these instructions at home: ? ?Take over-the-counter and prescription medicines only as told by your health care provider. ?Use an inhaler, vaporizer, or humidifier as told by your health care provider. ?Take two teaspoons (10 mL) of honey at bedtime to lessen coughing at night. ?Drink enough fluid to keep your urine pale yellow. ?Do not use any products that contain nicotine or tobacco. These products include cigarettes, chewing tobacco, and vaping devices, such as e-cigarettes. If you need help quitting, ask your health care provider. ?Get plenty of rest. ?Return to your normal activities as told by your health care provider. Ask your health care provider what activities are safe  for you. ?Keep all follow-up visits. This is important. ?How is this prevented? ?To lower your risk of getting this condition again: ?Wash your hands often with soap and water for at least 20 seconds. If soap and water are  not available, use hand sanitizer. ?Avoid contact with people who have cold symptoms. ?Try not to touch your mouth, nose, or eyes with your hands. ?Avoid breathing in smoke or chemical fumes. Breathing smoke or chemical fumes will make your condition worse. ?Get the flu shot every year. ?Contact a health care provider if: ?Your symptoms do not improve after 2 weeks. ?You have trouble coughing up the mucus. ?Your cough keeps you awake at night. ?You have a fever. ?Get help right away if you: ?Cough up blood. ?Feel pain in your chest. ?Have severe shortness of breath. ?Faint or keep feeling like you are going to faint. ?Have a severe headache. ?Have a fever or chills that get worse. ?These symptoms may represent a serious problem that is an emergency. Do not wait to see if the symptoms will go away. Get medical help right away. Call your local emergency services (911 in the [place] Do not drive yourself to the hospital. ?Summary ?Acute bronchitis is inflammation of the main airways (bronchi) that come off the windpipe (trachea) in the lungs. The swelling causes the airways to get smaller and make more mucus than normal. ?Drinking more fluids can help thin your mucus so it is easier to cough up. ?Take over-the-counter and prescription medicines only as told by your health care provider. ?Do not use any products that contain nicotine or tobacco. These products include cigarettes, chewing tobacco, and vaping devices, such as e-cigarettes. If you need help quitting, ask your health care provider. ?Contact a health care provider if your symptoms do not improve after 2 weeks. ?This information is not intended to replace advice given to you by your health care provider. Make sure you discuss any questions you have with your health care provider. ?Document Revised: 09/03/2020 Document Reviewed: 09/03/2020 ?Elsevier Patient Education ? [employer]. ? ?

## 2021-09-02 NOTE — Progress Notes (Signed)
? ?Acute Office Visit ? ?Subjective:  ? ?  ?Patient ID: Albert Martin, male    DOB: 1950-06-12, 71 y.o.   MRN: [id number] ? ?Chief Complaint  ?Patient presents with  ? URI  ? ? ?States that he was seen at urgent care on August 27, 2021 with a complaint of runny nose and congestion.  Hospital course ?  ?MDM:  ?TAMIM SKOG is a 71 y.o. male presenting for runny nose and congestion without significant fever or colored discharge.  Patient denies history of allergies but has been having a lot of runny nose and cough with wheeze.  His exam showed evidence of allergic rhinitis he did have an expiratory wheeze on strong cough.  His vital signs were unremarkable except mild elevation of temperature at 100 there was no sinus tenderness on exam or evidence of bacterial infection.  He was prescribed prednisone Nasonex and albuterol and given strict return instructions.  I discussed treatment, follow up and return instructions. Questions were answered. Patient stated understanding of instructions and is stable for discharge. ?  ?Leida Lauth FNP-C MCN  ?  ?Hezzie Bump, NP ?08/27/21 1533 ? ? ?States today that he did have a short amount of relief from using the steroids, but states that he continues to have a productive cough with yellow sputum, shortness of breath, wheezing.  States that he has was unable to use the Nasonex due to prescription not being filled by pharmacy. ? ?States the cough is bothersome and keeping him awake at night.  States that he recently quit smoking.  States that he had been smoking for 1 year after had quit for 10 years.  States that he restarted smoking after his wife passed away. ? ?Past Medical History:  ?Diagnosis Date  ? Arthritis   ? Hyperlipidemia   ? Hypertension   ? ?Social History  ? ?Socioeconomic History  ? Marital status: Married  ?  Spouse name: Not on file  ? Number of children: 4  ? Years of education: Not on file  ? Highest education level: Not on file  ?Occupational History   ? Not on file  ?Tobacco Use  ? Smoking status: Every Day  ?  Packs/day: 0.25  ?  Years: 10.00  ?  Pack years: 2.50  ?  Types: Cigarettes  ?  Last attempt to quit: 08/15/2009  ?  Years since quitting: 12.0  ? Smokeless tobacco: Never  ?Vaping Use  ? Vaping Use: Never used  ?Substance and Sexual Activity  ? Alcohol use: No  ?  Alcohol/week: 0.0 standard drinks  ? Drug use: No  ? Sexual activity: Yes  ?Other Topics Concern  ? Not on file  ?Social History Narrative  ? Not on file  ? ?Social Determinants of Health  ? ?Financial Resource Strain: Not on file  ?Food Insecurity: Not on file  ?Transportation Needs: Not on file  ?Physical Activity: Not on file  ?Stress: Not on file  ?Social Connections: Not on file  ?Intimate Partner Violence: Not on file  ? ?Family History  ?Problem Relation Age of Onset  ? Lung cancer Mother   ? Hypertension Mother   ? Arthritis Mother   ? Esophageal cancer Mother   ? Hypertension Father   ? Arthritis Father   ? Hypertension Brother   ? Arthritis Brother   ? Lung cancer Maternal Aunt   ? Colon cancer Neg Hx   ? Colon polyps Neg Hx   ? Rectal cancer Neg  Hx   ? Stomach cancer Neg Hx   ? ?No Known Allergies ? ? ?Review of Systems  ?Constitutional:  Negative for chills and fever.  ?HENT:  Positive for congestion. Negative for sore throat.   ?Eyes: Negative.   ?Respiratory:  Positive for cough, sputum production, shortness of breath and wheezing.   ?Cardiovascular:  Negative for chest pain.  ?Gastrointestinal:  Negative for nausea and vomiting.  ?Genitourinary: Negative.   ?Musculoskeletal: Negative.   ?Skin: Negative.   ?Neurological: Negative.   ?Endo/Heme/Allergies: Negative.   ?Psychiatric/Behavioral: Negative.    ? ? ?   ?Objective:  ?  ?BP 123/78 (BP Location: Left Arm, Patient Position: Sitting, Cuff Size: Normal)   Pulse 86   Temp 98.8 ?F (37.1 ?C) (Oral)   Resp 18   Ht '5\' 11"'$  (1.803 m)   Wt 161 lb (73 kg)   SpO2 92%   BMI 22.45 kg/m?  ? ? ?Physical Exam ?Vitals and nursing note  reviewed.  ?Constitutional:   ?   General: He is not in acute distress. ?   Appearance: Normal appearance. He is not ill-appearing.  ?HENT:  ?   Head: Normocephalic and atraumatic.  ?   Salivary Glands: Right salivary gland is not diffusely enlarged. Left salivary gland is not diffusely enlarged.  ?   Right Ear: Tympanic membrane, ear canal and external ear normal.  ?   Left Ear: Tympanic membrane, ear canal and external ear normal.  ?   Nose:  ?   Right Turbinates: Swollen.  ?   Left Turbinates: Swollen.  ?   Right Sinus: No maxillary sinus tenderness or frontal sinus tenderness.  ?   Left Sinus: No maxillary sinus tenderness or frontal sinus tenderness.  ?Cardiovascular:  ?   Rate and Rhythm: Normal rate and regular rhythm.  ?   Pulses: Normal pulses.  ?   Heart sounds: Normal heart sounds.  ?Pulmonary:  ?   Breath sounds: Examination of the right-lower field reveals rhonchi. Examination of the left-lower field reveals rhonchi. Rhonchi present.  ?Musculoskeletal:     ?   General: Normal range of motion.  ?   Cervical back: Normal range of motion and neck supple.  ?Skin: ?   General: Skin is warm and dry.  ?Neurological:  ?   General: No focal deficit present.  ?   Mental Status: He is alert and oriented to person, place, and time.  ?Psychiatric:     ?   Mood and Affect: Mood normal.     ?   Behavior: Behavior normal.     ?   Thought Content: Thought content normal.     ?   Judgment: Judgment normal.  ? ? ?No results found for any visits on 09/02/21. ? ? ?   ?Assessment & Plan:  ? ?Problem List Items Addressed This Visit   ? ?  ? Other  ? TOBACCO ABUSE  ? ?Other Visit Diagnoses   ? ? Acute bacterial bronchitis    -  Primary  ? Relevant Medications  ? azithromycin (ZITHROMAX) 250 MG tablet  ? benzonatate (TESSALON) 200 MG capsule  ? predniSONE (DELTASONE) 20 MG tablet  ? Other Relevant Orders  ? DG Chest 2 View  ? ?  ? ? ?Meds ordered this encounter  ?Medications  ? azithromycin (ZITHROMAX) 250 MG tablet  ?  Sig:  Take 2 tabs PO day 1, then take 1 tab PO once daily  ?  Dispense:  6 tablet  ?  Refill:  0  ?  Order Specific Question:   Supervising Provider  ?  Answer:   Elsie Stain [[id number]]  ? benzonatate (TESSALON) 200 MG capsule  ?  Sig: Take 1 capsule (200 mg total) by mouth 2 (two) times daily as needed for cough.  ?  Dispense:  20 capsule  ?  Refill:  0  ?  Order Specific Question:   Supervising Provider  ?  Answer:   Elsie Stain [[id number]]  ? predniSONE (DELTASONE) 20 MG tablet  ?  Sig: Take 3 tablets (60 mg total) by mouth daily with breakfast for 2 days, THEN 2 tablets (40 mg total) daily with breakfast for 2 days, THEN 1 tablet (20 mg total) daily with breakfast for 2 days, THEN 0.5 tablets (10 mg total) daily with breakfast for 2 days.  ?  Dispense:  13 tablet  ?  Refill:  0  ?  Order Specific Question:   Supervising Provider  ?  Answer:   Elsie Stain [[id number]]  ?1. Acute bacterial bronchitis ?Patient did have stats up to 95% with deep breathing.  Reasonable to have chest x-ray completed to rule out pneumonia.  Trial azithromycin, Tessalon Perles, prednisone taper.  Patient education given on supportive care, red flags for prompt reevaluation. ?- DG Chest 2 View ?- azithromycin (ZITHROMAX) 250 MG tablet; Take 2 tabs PO day 1, then take 1 tab PO once daily  Dispense: 6 tablet; Refill: 0 ?- benzonatate (TESSALON) 200 MG capsule; Take 1 capsule (200 mg total) by mouth 2 (two) times daily as needed for cough.  Dispense: 20 capsule; Refill: 0 ?- predniSONE (DELTASONE) 20 MG tablet; Take 3 tablets (60 mg total) by mouth daily with breakfast for 2 days, THEN 2 tablets (40 mg total) daily with breakfast for 2 days, THEN 1 tablet (20 mg total) daily with breakfast for 2 days, THEN 0.5 tablets (10 mg total) daily with breakfast for 2 days.  Dispense: 13 tablet; Refill: 0 ? ?2. History of tobacco abuse ? ? ? ?I have reviewed the patient's medical history (PMH, PSH, Social History, Family History, Medications, and  allergies) , and have been updated if relevant. I spent 30 minutes reviewing chart and  face to face time with patient. ? ? ? ? ?Return if symptoms worsen or fail to improve. ? ?Thong Feeny S Mayers, PA-C ? ? ?

## 2021-09-02 NOTE — Progress Notes (Signed)
Patient has taken medication today and has eaten today. ?Patient denies pain at this time. ?Patient reports yellow productive cough and requesting refill. ?

## 2021-09-07 NOTE — Addendum Note (Signed)
Addended by: Kennieth Rad on: 09/07/2021 11:00 AM ? ? Modules accepted: Orders ? ?

## 2021-09-11 ENCOUNTER — Telehealth: Payer: Self-pay | Admitting: *Deleted

## 2021-09-11 NOTE — Telephone Encounter (Signed)
Patient verified DOB ?Patient is aware of results and receiving a follow up call from the pre cert team to get him scheduled. If he has not been contacted by next Thursday. Call the office for assistance. ? ?

## 2021-09-11 NOTE — Telephone Encounter (Signed)
-----   Message from Kennieth Rad, PA-C sent at 09/07/2021 11:00 AM EDT ----- ?Please call patient and let him know that his chest x-ray did not show any current concern of pneumonia.  Does show progression of interstitial opacities from last chest x-ray in 2020.  Does recommend further evaluation with chest CT. ?

## 2021-09-11 NOTE — Telephone Encounter (Signed)
Pt. Calling to verify results from chest x-ray. ?

## 2021-10-19 ENCOUNTER — Ambulatory Visit: Payer: Medicare (Managed Care) | Admitting: Internal Medicine

## 2021-11-12 ENCOUNTER — Encounter: Payer: Self-pay | Admitting: Physician Assistant

## 2021-11-12 ENCOUNTER — Ambulatory Visit: Payer: Medicare PPO | Attending: Physician Assistant | Admitting: Physician Assistant

## 2021-11-12 VITALS — BP 135/89 | HR 78 | Temp 98.1°F | Wt 165.0 lb

## 2021-11-12 DIAGNOSIS — M159 Polyosteoarthritis, unspecified: Secondary | ICD-10-CM | POA: Diagnosis not present

## 2021-11-12 DIAGNOSIS — E785 Hyperlipidemia, unspecified: Secondary | ICD-10-CM

## 2021-11-12 DIAGNOSIS — N529 Male erectile dysfunction, unspecified: Secondary | ICD-10-CM

## 2021-11-12 DIAGNOSIS — I10 Essential (primary) hypertension: Secondary | ICD-10-CM

## 2021-11-12 MED ORDER — DULOXETINE HCL 20 MG PO CPEP: ORAL_CAPSULE | ORAL | 6 refills | Status: DC

## 2021-11-12 MED ORDER — TADALAFIL 20 MG PO TABS: ORAL_TABLET | ORAL | 4 refills | Status: DC

## 2021-11-12 MED ORDER — HYDROCHLOROTHIAZIDE 25 MG PO TABS: 25.0000 mg | ORAL_TABLET | Freq: Every day | ORAL | 1 refills | Status: DC

## 2021-11-12 MED ORDER — ROSUVASTATIN CALCIUM 20 MG PO TABS: 20.0000 mg | ORAL_TABLET | Freq: Every day | ORAL | 4 refills | Status: DC

## 2021-11-12 MED ORDER — ALBUTEROL SULFATE HFA 108 (90 BASE) MCG/ACT IN AERS: 1.0000 | INHALATION_SPRAY | Freq: Four times a day (QID) | RESPIRATORY_TRACT | 0 refills | Status: DC | PRN

## 2021-11-12 MED ORDER — AMLODIPINE BESYLATE 10 MG PO TABS: 10.0000 mg | ORAL_TABLET | Freq: Every day | ORAL | 6 refills | Status: DC

## 2021-11-12 MED ORDER — MOMETASONE FUROATE 50 MCG/ACT NA SUSP: 2.0000 | Freq: Every day | NASAL | 12 refills | Status: DC

## 2021-11-12 MED ORDER — MELOXICAM 15 MG PO TABS: 15.0000 mg | ORAL_TABLET | Freq: Every day | ORAL | 1 refills | Status: DC

## 2021-11-12 NOTE — Progress Notes (Signed)
Medication refill,  but wants a script for cialis  Discuss imaging results 08/2021

## 2021-11-12 NOTE — Progress Notes (Signed)
Patient ID: Albert Martin, male   DOB: 1951-01-29, 71 y.o.   MRN: [id number]   Albert Martin, is a 71 y.o. male  [id number]  IRS:[id number]  DOB - [birth year]  Chief Complaint  Patient presents with   Medication Refill       Subjective:   Albert Martin is a 71 y.o. male here today for med RF.  No issues or concerns.  Needs cialis to [facility] pharmacy and [facility]  He was out of amlodipine prior to today.  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    No CP/SOB/HA/dizziness.  Denies SI/HI  No problems updated.  ALLERGIES: No Known Allergies  PAST MEDICAL HISTORY: Past Medical History:  Diagnosis Date   Arthritis    Hyperlipidemia    Hypertension     MEDICATIONS AT HOME: Prior to Admission medications   Medication Sig Start Date End Date Taking? Authorizing Provider  Testosterone Cypionate 150 MG/ML SOLN Inject 150 mg as directed every 14 (fourteen) days. 12/18/18  Yes Ladell Pier, MD  albuterol (VENTOLIN HFA) 108 (90 Base) MCG/ACT inhaler Inhale 1-2 puffs into the lungs every 6 (six) hours as needed for wheezing or shortness of breath. 11/12/21   Argentina Donovan, PA-C  amLODipine (NORVASC) 10 MG tablet Take 1 tablet (10 mg total) by mouth daily. 11/12/21   Argentina Donovan, PA-C  DULoxetine (CYMBALTA) 20 MG capsule TAKE 1 CAPSULE BY MOUTH EVERY DAY 11/12/21   Argentina Donovan, PA-C  hydrochlorothiazide (HYDRODIURIL) 25 MG tablet Take 1 tablet (25 mg total) by mouth daily. 11/12/21   Argentina Donovan, PA-C  meloxicam (MOBIC) 15 MG tablet Take 1 tablet (15 mg total) by mouth daily. 11/12/21   Argentina Donovan, PA-C  mometasone (NASONEX) 50 MCG/ACT nasal spray Place 2 sprays into the nose daily. 11/12/21   Argentina Donovan, PA-C  rosuvastatin (CRESTOR) 20 MG tablet Take 1 tablet (20 mg total) by mouth daily. 11/12/21   Argentina Donovan, PA-C  tadalafil (CIALIS) 20 MG tablet Take 1 tablet 1-2 hours before sexual activity(may take 1 tab in 3 days) 11/12/21    Naarah Borgerding, Dionne Bucy, PA-C    ROS: Neg HEENT Neg resp Neg cardiac Neg GI Neg GU Neg MS Neg psych Neg neuro  Objective:   Vitals:   11/12/21 0852  BP: 135/89  Pulse: 78  Temp: 98.1 F (36.7 C)  TempSrc: Oral  SpO2: 97%  Weight: 165 lb (74.8 kg)   Exam General appearance : Awake, alert, not in any distress. Speech Clear. Not toxic looking HEENT: Atraumatic and Normocephalic Neck: Supple, no JVD. No cervical lymphadenopathy.  Chest: Good air entry bilaterally, CTAB.  No rales/rhonchi/wheezing CVS: S1 S2 regular, no murmurs.  Extremities: B/L Lower Ext shows no edema, both legs are warm to touch Neurology: Awake alert, and oriented X 3, CN II-XII intact, Non focal Skin: No Rash  Data Review Lab Results  Component Value Date   HGBA1C 5.1 07/04/2006    Assessment & Plan   1. Primary osteoarthritis involving multiple joints - meloxicam (MOBIC) 15 MG tablet; Take 1 tablet (15 mg total) by mouth daily.  Dispense: 90 tablet; Refill: 1 - DULoxetine (CYMBALTA) 20 MG capsule; TAKE 1 CAPSULE BY MOUTH EVERY DAY  Dispense: 30 capsule; Refill: 6  2. Hyperlipidemia, unspecified hyperlipidemia type - rosuvastatin (CRESTOR) 20 MG tablet; Take 1 tablet (20 mg total) by mouth daily.  Dispense: 90 tablet; Refill: 4 - CBC with Differential/Platelet - Lipid panel  3. Essential hypertension Suboptimal numbers in clinic but not taking amlodipine - hydrochlorothiazide (HYDRODIURIL) 25 MG tablet; Take 1 tablet (25 mg total) by mouth daily.  Dispense: 90 tablet; Refill: 1 - amLODipine (NORVASC) 10 MG tablet; Take 1 tablet (10 mg total) by mouth daily.  Dispense: 30 tablet; Refill: 6 - Comprehensive metabolic panel - CBC with Differential/Platelet  4. Erectile dysfunction, unspecified erectile dysfunction type - tadalafil (CIALIS) 20 MG tablet; Take 1 tablet 1-2 hours before sexual activity(may take 1 tab in 3 days)  Dispense: 30 tablet; Refill: 4 Discussed nitrates in case of CP and  patient verbalizes understanding to notify medical professionals in case of emergency.     Return in about 6 months (around 05/14/2022) for PCP for chronic conditions.  The patient was given clear instructions to go to ER or return to medical center if symptoms don't improve, worsen or new problems develop. The patient verbalized understanding. The patient was told to call to get lab results if they haven't heard anything in the next week.      Freeman Caldron, PA-C [facility] and [facility] [place], [place]   11/12/2021, 9:12 AM

## 2021-11-13 ENCOUNTER — Other Ambulatory Visit: Payer: Self-pay | Admitting: Physician Assistant

## 2021-11-13 LAB — COMPREHENSIVE METABOLIC PANEL
ALT: 14 IU/L (ref 0–44)
AST: 11 IU/L (ref 0–40)
Albumin/Globulin Ratio: 1.4 (ref 1.2–2.2)
Albumin: 4.5 g/dL (ref 3.7–4.7)
Alkaline Phosphatase: 106 IU/L (ref 44–121)
BUN/Creatinine Ratio: 15 (ref 10–24)
BUN: 17 mg/dL (ref 8–27)
Bilirubin Total: 0.6 mg/dL (ref 0.0–1.2)
CO2: 21 mmol/L (ref 20–29)
Calcium: 9.5 mg/dL (ref 8.6–10.2)
Chloride: 105 mmol/L (ref 96–106)
Creatinine, Ser: 1.14 mg/dL (ref 0.76–1.27)
Globulin, Total: 3.3 g/dL (ref 1.5–4.5)
Glucose: 109 mg/dL — ABNORMAL HIGH (ref 70–99)
Potassium: 4.2 mmol/L (ref 3.5–5.2)
Sodium: 142 mmol/L (ref 134–144)
Total Protein: 7.8 g/dL (ref 6.0–8.5)
eGFR: 68 mL/min/{1.73_m2} (ref >59)

## 2021-11-13 LAB — CBC WITH DIFFERENTIAL/PLATELET
Basophils Absolute: 0 10*3/uL (ref 0.0–0.2)
Basos: 0 %
EOS (ABSOLUTE): 0.2 10*3/uL (ref 0.0–0.4)
Eos: 2 %
Hematocrit: 45.3 % (ref 37.5–51.0)
Hemoglobin: 14.9 g/dL (ref 13.0–17.7)
Immature Grans (Abs): 0 10*3/uL (ref 0.0–0.1)
Immature Granulocytes: 0 %
Lymphocytes Absolute: 1.3 10*3/uL (ref 0.7–3.1)
Lymphs: 13 %
MCH: 27.7 pg (ref 26.6–33.0)
MCHC: 32.9 g/dL (ref 31.5–35.7)
MCV: 84 fL (ref 79–97)
Monocytes Absolute: 0.7 10*3/uL (ref 0.1–0.9)
Monocytes: 7 %
Neutrophils Absolute: 7.5 10*3/uL — ABNORMAL HIGH (ref 1.4–7.0)
Neutrophils: 78 %
Platelets: 252 10*3/uL (ref 150–450)
RBC: 5.38 x10E6/uL (ref 4.14–5.80)
RDW: 12.2 % (ref 11.6–15.4)
WBC: 9.8 10*3/uL (ref 3.4–10.8)

## 2021-11-13 LAB — LIPID PANEL
Chol/HDL Ratio: 4.8 ratio (ref 0.0–5.0)
Cholesterol, Total: 224 mg/dL — ABNORMAL HIGH (ref 100–199)
HDL: 47 mg/dL (ref >39)
LDL Chol Calc (NIH): 161 mg/dL — ABNORMAL HIGH (ref 0–99)
Triglycerides: 91 mg/dL (ref 0–149)
VLDL Cholesterol Cal: 16 mg/dL (ref 5–40)

## 2021-12-19 ENCOUNTER — Other Ambulatory Visit: Payer: Self-pay | Admitting: Internal Medicine

## 2021-12-19 DIAGNOSIS — M159 Polyosteoarthritis, unspecified: Secondary | ICD-10-CM

## 2021-12-19 DIAGNOSIS — I10 Essential (primary) hypertension: Secondary | ICD-10-CM

## 2021-12-21 NOTE — Telephone Encounter (Signed)
Norvasc and Cymbalta refill requests refused because being requested too early both were 11/12/2021 #30 with 6 refills each.

## 2022-02-15 ENCOUNTER — Encounter: Payer: Self-pay | Admitting: Gastroenterology

## 2022-04-15 ENCOUNTER — Encounter (HOSPITAL_COMMUNITY): Payer: Self-pay | Admitting: *Deleted

## 2022-04-15 ENCOUNTER — Ambulatory Visit (HOSPITAL_COMMUNITY)
Admission: EM | Admit: 2022-04-15 | Discharge: 2022-04-15 | Disposition: A | Discharge status: Home / Self Care | Payer: Medicare PPO | Attending: Sports Medicine | Admitting: Sports Medicine

## 2022-04-15 DIAGNOSIS — R0981 Nasal congestion: Secondary | ICD-10-CM

## 2022-04-15 DIAGNOSIS — R051 Acute cough: Secondary | ICD-10-CM

## 2022-04-15 MED ORDER — FLUTICASONE PROPIONATE 50 MCG/ACT NA SUSP: 2.0000 | Freq: Every day | NASAL | 0 refills | Status: DC

## 2022-04-15 NOTE — ED Provider Notes (Signed)
[facility]    CSN: [id number] Arrival date & time: 04/15/22  0859      History   Chief Complaint Chief Complaint  Patient presents with   Nasal Congestion   Cough    HPI Albert Martin is a 71 y.o. male.   [name] presents today with chief complaint of some nasal congestion and cough for the past 3 to 4 days.  He reports he is getting better he has not tried any medications for symptoms at this time.  He does work in a Loss adjuster, chartered and reports there is frequently sick people around him.  He denies any fevers, chills, nausea, vomiting or diarrhea.  He was concerned today that he may have the flu and was requesting the flu shot.  We counseled him on fluid management and supportive care today.  He verbalized understanding.  We did discuss COVID and flu testing however as his symptoms began 3 to 4 days ago he would be outside the window for flu medications and he is not currently interested in COVID antiviral treatment at this time. Of note he is currently cutting back on his smoking as he is trying to quit again.   Cough   Past Medical History:  Diagnosis Date   Arthritis    Hyperlipidemia    Hypertension     Patient Active Problem List   Diagnosis Date Noted   Positive hepatitis C antibody test 06/09/2021   Erythrocytosis 12/18/2018   History of colon polyps 09/15/2018   Low testosterone level in male 09/15/2018   Erectile dysfunction 09/15/2018   Primary osteoarthritis involving multiple joints 09/15/2018   Hyperlipidemia 05/26/2007   ERECTILE DYSFUNCTION 05/26/2007   TOBACCO ABUSE 05/26/2007   HEMATURIA UNSPECIFIED 05/26/2007   OSTEOARTHRITIS 05/26/2007   ARTHRITIS, SHOULDERS, BILATERAL 05/26/2007   ARTHRITIS, BACK 05/26/2007   Essential hypertension 07/05/2006    Past Surgical History:  Procedure Laterality Date   INGUINAL HERNIA REPAIR     WISDOM TOOTH EXTRACTION         Home Medications    Prior to Admission medications   Medication  Sig Start Date End Date Taking? Authorizing Provider  albuterol (VENTOLIN HFA) 108 (90 Base) MCG/ACT inhaler Inhale 1-2 puffs into the lungs every 6 (six) hours as needed for wheezing or shortness of breath. 11/12/21  Yes McClung, Angela M, PA-C  amLODipine (NORVASC) 10 MG tablet Take 1 tablet (10 mg total) by mouth daily. 11/12/21  Yes McClung, Dionne Bucy, PA-C  DULoxetine (CYMBALTA) 20 MG capsule TAKE 1 CAPSULE BY MOUTH EVERY DAY 11/12/21  Yes McClung, Angela M, PA-C  fluticasone (FLONASE) 50 MCG/ACT nasal spray Place 2 sprays into both nostrils daily. 04/15/22  Yes Rodena Goldmann A, DO  hydrochlorothiazide (HYDRODIURIL) 25 MG tablet Take 1 tablet (25 mg total) by mouth daily. 11/12/21  Yes McClung, Dionne Bucy, PA-C  meloxicam (MOBIC) 15 MG tablet Take 1 tablet (15 mg total) by mouth daily. 11/12/21  Yes Freeman Caldron M, PA-C  rosuvastatin (CRESTOR) 20 MG tablet Take 1 tablet (20 mg total) by mouth daily. 11/12/21  Yes Freeman Caldron M, PA-C  tadalafil (CIALIS) 20 MG tablet Take 1 tablet 1-2 hours before sexual activity(may take 1 tab in 3 days) 11/12/21  Yes McClung, Dionne Bucy, PA-C  mometasone (NASONEX) 50 MCG/ACT nasal spray Place 2 sprays into the nose daily. 11/12/21   Argentina Donovan, PA-C  Testosterone Cypionate 150 MG/ML SOLN Inject 150 mg as directed every 14 (fourteen) days. 12/18/18   Wynetta Emery,  Dalbert Batman, MD    Family History Family History  Problem Relation Age of Onset   Lung cancer Mother    Hypertension Mother    Arthritis Mother    Esophageal cancer Mother    Hypertension Father    Arthritis Father    Hypertension Brother    Arthritis Brother    Lung cancer Maternal Aunt    Colon cancer Neg Hx    Colon polyps Neg Hx    Rectal cancer Neg Hx    Stomach cancer Neg Hx     Social History Social History   Tobacco Use   Smoking status: Every Day    Packs/day: 0.25    Years: 10.00    Total pack years: 2.50    Types: Cigarettes    Last attempt to quit: 08/15/2009    Years  since quitting: 12.6   Smokeless tobacco: Never  Vaping Use   Vaping Use: Never used  Substance Use Topics   Alcohol use: No    Alcohol/week: 0.0 standard drinks of alcohol   Drug use: No     Allergies   Patient has no known allergies.   Review of Systems Review of Systems  Respiratory:  Positive for cough.   As listed above in HPI   Physical Exam Triage Vital Signs ED Triage Vitals  Enc Vitals Group     BP 04/15/22 0952 (!) 159/100     Pulse Rate 04/15/22 0952 70     Resp 04/15/22 0952 18     Temp 04/15/22 0952 98.5 F (36.9 C)     Temp Source 04/15/22 0952 Oral     SpO2 04/15/22 0952 95 %     Weight --      Height --      Head Circumference --      Peak Flow --      Pain Score 04/15/22 0948 0     Pain Loc --      Pain Edu? --      Excl. in [place]? --    No data found.  Updated Vital Signs BP (!) 159/100 (BP Location: Right Arm) Comment: he hasnt taken his BP meds today  Pulse 70   Temp 98.5 F (36.9 C) (Oral)   Resp 18   SpO2 95%   Physical Exam Vitals reviewed.  Constitutional:      General: He is not in acute distress.    Appearance: Normal appearance. He is not ill-appearing, toxic-appearing or diaphoretic.  HENT:     Nose: No rhinorrhea.     Mouth/Throat:     Mouth: Mucous membranes are moist.  Eyes:     General:        Right eye: No discharge.        Left eye: No discharge.     Conjunctiva/sclera: Conjunctivae normal.     Pupils: Pupils are equal, round, and reactive to light.  Cardiovascular:     Rate and Rhythm: Normal rate.     Heart sounds: No murmur heard. Pulmonary:     Effort: Pulmonary effort is normal. No respiratory distress.     Breath sounds: No stridor. No wheezing.  Skin:    General: Skin is warm.  Neurological:     Mental Status: He is alert.  Psychiatric:        Mood and Affect: Mood normal.        Behavior: Behavior normal.        Thought Content: Thought content normal.  Judgment: Judgment normal.      UC  Treatments / Results  Labs (all labs ordered are listed, but only abnormal results are displayed) Labs Reviewed - No data to display  EKG   Radiology No results found.  Procedures Procedures (including critical care time)  Medications Ordered in UC Medications - No data to display  Initial Impression / Assessment and Plan / UC Course  I have reviewed the triage vital signs and the nursing notes.  Pertinent labs & imaging results that were available during my care of the patient were reviewed by me and considered in my medical decision making (see chart for details).     Patient is here for congestion and cough that has improved throughout the week.  I recommend continuing supportive treatment and he may begin Flonase or continue his home Nasonex.  He may also try decongestant.  Adequate hydration and meals.  If he does not have any improvement over the next 7 days or worsens I recommend he return to urgent care or his primary care provider for reevaluation. Final Clinical Impressions(s) / UC Diagnoses   Final diagnoses:  Nasal congestion  Acute cough     Discharge Instructions      For your cough and congestion I recommend some over-the-counter Mucinex for your congestion.  You likely have a cold.  You may also try some Flonase or use your previously prescribed Nasonex.  I recommend adequate hydration and lots of food.  COVID and flu testing would not change our management today in light of.  Symptoms timeframes we will hold off at this time.  If your symptoms worsen or fail to improve over the next 7 days recommend return to urgent care or your primary care provider.    ED Prescriptions     Medication Sig Dispense Auth. Provider   fluticasone (FLONASE) 50 MCG/ACT nasal spray Place 2 sprays into both nostrils daily. 11 mL Rodena Goldmann A, DO      PDMP not reviewed this encounter.   Elmore Guise, DO 04/15/22 1032

## 2022-04-15 NOTE — ED Triage Notes (Signed)
Pt states he has had congestion X 3 days, last night he started coughing. No fever. He hasn't taken any meds for the sx. He wanted to know if we could give flu vaccine I advised we dont offer it here he can go to his PCP or a pharmacy. He does have a refill of albuterol at the pharmacy he thinks but he hasn't picked it up.

## 2022-04-15 NOTE — Discharge Instructions (Signed)
For your cough and congestion I recommend some over-the-counter Mucinex for your congestion.  You likely have a cold.  You may also try some Flonase or use your previously prescribed Nasonex.  I recommend adequate hydration and lots of food.  COVID and flu testing would not change our management today in light of.  Symptoms timeframes we will hold off at this time.  If your symptoms worsen or fail to improve over the next 7 days recommend return to urgent care or your primary care provider.

## 2022-05-06 ENCOUNTER — Ambulatory Visit: Payer: Medicare PPO | Admitting: Internal Medicine

## 2022-06-04 ENCOUNTER — Telehealth: Payer: Self-pay | Admitting: Internal Medicine

## 2022-06-04 NOTE — Telephone Encounter (Signed)
Tried calling patient to schedule Medicare Annual Wellness Visit (AWV) either virtually or phone  No answer   My Herbie Drape number [phone]   Last AWV 02/23/21

## 2022-06-25 ENCOUNTER — Ambulatory Visit (HOSPITAL_COMMUNITY)
Admission: EM | Admit: 2022-06-25 | Discharge: 2022-06-25 | Disposition: A | Discharge status: Home / Self Care | Payer: Medicare PPO | Attending: Family Medicine | Admitting: Family Medicine

## 2022-06-25 ENCOUNTER — Encounter (HOSPITAL_COMMUNITY): Payer: Self-pay | Admitting: *Deleted

## 2022-06-25 DIAGNOSIS — R52 Pain, unspecified: Secondary | ICD-10-CM | POA: Diagnosis present

## 2022-06-25 DIAGNOSIS — F17210 Nicotine dependence, cigarettes, uncomplicated: Secondary | ICD-10-CM | POA: Diagnosis not present

## 2022-06-25 DIAGNOSIS — Z1152 Encounter for screening for COVID-19: Secondary | ICD-10-CM | POA: Insufficient documentation

## 2022-06-25 DIAGNOSIS — R059 Cough, unspecified: Secondary | ICD-10-CM | POA: Diagnosis present

## 2022-06-25 DIAGNOSIS — J069 Acute upper respiratory infection, unspecified: Secondary | ICD-10-CM | POA: Diagnosis not present

## 2022-06-25 LAB — SARS CORONAVIRUS 2 (TAT 6-24 HRS): SARS Coronavirus 2: NEGATIVE

## 2022-06-25 MED ORDER — BENZONATATE 100 MG PO CAPS: 100.0000 mg | ORAL_CAPSULE | Freq: Three times a day (TID) | ORAL | 0 refills | Status: DC | PRN

## 2022-06-25 NOTE — ED Provider Notes (Signed)
Albert Martin    CSN: [id number] Arrival date & time: 06/25/22  1103      History   Chief Complaint Chief Complaint  Patient presents with   Cough   Nasal Congestion   Generalized Body Aches    HPI Albert Martin is a 72 y.o. male.    Cough  Here for nasal congestion, cough, and a slight subjective fever.  Symptoms began yesterday evening.  No nausea or vomiting or diarrhea.  He does not feel short of breath.   He does smoke tobacco.  Past Medical History:  Diagnosis Date   Arthritis    Hyperlipidemia    Hypertension     Patient Active Problem List   Diagnosis Date Noted   Positive hepatitis C antibody test 06/09/2021   Erythrocytosis 12/18/2018   History of colon polyps 09/15/2018   Low testosterone level in male 09/15/2018   Erectile dysfunction 09/15/2018   Primary osteoarthritis involving multiple joints 09/15/2018   Hyperlipidemia 05/26/2007   ERECTILE DYSFUNCTION 05/26/2007   TOBACCO ABUSE 05/26/2007   HEMATURIA UNSPECIFIED 05/26/2007   OSTEOARTHRITIS 05/26/2007   ARTHRITIS, SHOULDERS, BILATERAL 05/26/2007   ARTHRITIS, BACK 05/26/2007   Essential hypertension 07/05/2006    Past Surgical History:  Procedure Laterality Date   INGUINAL HERNIA REPAIR     WISDOM TOOTH EXTRACTION         Home Medications    Prior to Admission medications   Medication Sig Start Date End Date Taking? Authorizing Provider  amLODipine (NORVASC) 10 MG tablet Take 1 tablet (10 mg total) by mouth daily. 11/12/21  Yes McClung, Dionne Bucy, PA-C  benzonatate (TESSALON) 100 MG capsule Take 1 capsule (100 mg total) by mouth 3 (three) times daily as needed for cough. 06/25/22  Yes Barrett Henle, MD  DULoxetine (CYMBALTA) 20 MG capsule TAKE 1 CAPSULE BY MOUTH EVERY DAY 11/12/21  Yes McClung, Angela M, PA-C  hydrochlorothiazide (HYDRODIURIL) 25 MG tablet Take 1 tablet (25 mg total) by mouth daily. 11/12/21  Yes McClung, Dionne Bucy, PA-C  meloxicam (MOBIC) 15 MG tablet  Take 1 tablet (15 mg total) by mouth daily. 11/12/21  Yes Freeman Caldron M, PA-C  rosuvastatin (CRESTOR) 20 MG tablet Take 1 tablet (20 mg total) by mouth daily. 11/12/21  Yes Freeman Caldron M, PA-C  tadalafil (CIALIS) 20 MG tablet Take 1 tablet 1-2 hours before sexual activity(may take 1 tab in 3 days) 11/12/21  Yes Thereasa Solo, Dionne Bucy, PA-C  albuterol (VENTOLIN HFA) 108 (90 Base) MCG/ACT inhaler Inhale 1-2 puffs into the lungs every 6 (six) hours as needed for wheezing or shortness of breath. 11/12/21   Thereasa Solo, Dionne Bucy, PA-C  fluticasone (FLONASE) 50 MCG/ACT nasal spray Place 2 sprays into both nostrils daily. 04/15/22   Rodena Goldmann A, DO  mometasone (NASONEX) 50 MCG/ACT nasal spray Place 2 sprays into the nose daily. 11/12/21   Argentina Donovan, PA-C  Testosterone Cypionate 150 MG/ML SOLN Inject 150 mg as directed every 14 (fourteen) days. 12/18/18   Ladell Pier, MD    Family History Family History  Problem Relation Age of Onset   Lung cancer Mother    Hypertension Mother    Arthritis Mother    Esophageal cancer Mother    Hypertension Father    Arthritis Father    Hypertension Brother    Arthritis Brother    Lung cancer Maternal Aunt    Colon cancer Neg Hx    Colon polyps Neg Hx    Rectal cancer Neg  Hx    Stomach cancer Neg Hx     Social History Social History   Tobacco Use   Smoking status: Every Day    Packs/day: 0.25    Years: 10.00    Total pack years: 2.50    Types: Cigarettes    Last attempt to quit: 08/15/2009    Years since quitting: 12.8   Smokeless tobacco: Never  Vaping Use   Vaping Use: Never used  Substance Use Topics   Alcohol use: No    Alcohol/week: 0.0 standard drinks of alcohol   Drug use: No     Allergies   Patient has no known allergies.   Review of Systems Review of Systems  Respiratory:  Positive for cough.      Physical Exam Triage Vital Signs ED Triage Vitals  Enc Vitals Group     BP 06/25/22 1132 (!) 146/97     Pulse  Rate 06/25/22 1132 72     Resp 06/25/22 1132 18     Temp 06/25/22 1132 98.7 F (37.1 C)     Temp src --      SpO2 06/25/22 1132 96 %     Weight --      Height --      Head Circumference --      Peak Flow --      Pain Score 06/25/22 1131 0     Pain Loc --      Pain Edu? --      Excl. in [place]? --    No data found.  Updated Vital Signs BP (!) 146/97 (BP Location: Left Arm)   Pulse 72   Temp 98.7 F (37.1 C)   Resp 18   SpO2 96%   Visual Acuity Right Eye Distance:   Left Eye Distance:   Bilateral Distance:    Right Eye Near:   Left Eye Near:    Bilateral Near:     Physical Exam Vitals reviewed.  Constitutional:      General: He is not in acute distress.    Appearance: He is not ill-appearing, toxic-appearing or diaphoretic.  HENT:     Right Ear: Tympanic membrane and ear canal normal.     Left Ear: Tympanic membrane and ear canal normal.     Nose: Nose normal.     Mouth/Throat:     Mouth: Mucous membranes are moist.     Comments: There is some clear mucus draining in the oropharynx. Eyes:     Extraocular Movements: Extraocular movements intact.     Conjunctiva/sclera: Conjunctivae normal.     Pupils: Pupils are equal, round, and reactive to light.  Cardiovascular:     Rate and Rhythm: Normal rate and regular rhythm.     Heart sounds: No murmur heard. Pulmonary:     Effort: No respiratory distress.     Breath sounds: No stridor. No wheezing, rhonchi or rales.  Musculoskeletal:     Cervical back: Neck supple.  Lymphadenopathy:     Cervical: No cervical adenopathy.  Skin:    Capillary Refill: Capillary refill takes less than 2 seconds.     Coloration: Skin is not jaundiced or pale.  Neurological:     General: No focal deficit present.     Mental Status: He is alert and oriented to person, place, and time.  Psychiatric:        Behavior: Behavior normal.      UC Treatments / Results  Labs (all labs ordered are listed, but only  abnormal results are  displayed) Labs Reviewed  SARS CORONAVIRUS 2 (TAT 6-24 HRS)    EKG   Radiology No results found.  Procedures Procedures (including critical care time)  Medications Ordered in UC Medications - No data to display  Initial Impression / Assessment and Plan / UC Course  I have reviewed the triage vital signs and the nursing notes.  Pertinent labs & imaging results that were available during my care of the patient were reviewed by me and considered in my medical decision making (see chart for details).        He is swabbed for COVID.  Due to his tobacco use, if he is positive for COVID he is a candidate for Paxlovid.  His last EGFR in June 2023 was 59  Tessalon Perles are sent in for the cough.  He wants to return to work tonight as scheduled.  Since he masks up anyway with an N95, I will provide a work note stating he can return to work tonight Final Clinical Impressions(s) / UC Diagnoses   Final diagnoses:  Viral upper respiratory tract infection     Discharge Instructions      Take benzonatate 100 mg, 1 tab every 8 hours as needed for cough.   You have been swabbed for COVID, and the test will result in the next 24 hours. Our staff will call you if positive. If the COVID test is positive, you should quarantine for 5 days from the start of your symptoms.  On days 6-10 from the start of your illness, you should wear a mask if out in public.  If your MyChart sends you a message that you have a positive COVID test, please call in to our facility tomorrow.  We do not have a callback nurse on weekends.     ED Prescriptions     Medication Sig Dispense Auth. Provider   benzonatate (TESSALON) 100 MG capsule Take 1 capsule (100 mg total) by mouth 3 (three) times daily as needed for cough. 21 capsule Barrett Henle, MD      PDMP not reviewed this encounter.   Barrett Henle, MD 06/25/22 [phone]

## 2022-06-25 NOTE — Discharge Instructions (Signed)
Take benzonatate 100 mg, 1 tab every 8 hours as needed for cough.   You have been swabbed for COVID, and the test will result in the next 24 hours. Our staff will call you if positive. If the COVID test is positive, you should quarantine for 5 days from the start of your symptoms.  On days 6-10 from the start of your illness, you should wear a mask if out in public.  If your MyChart sends you a message that you have a positive COVID test, please call in to our facility tomorrow.  We do not have a callback nurse on weekends.

## 2022-06-25 NOTE — ED Triage Notes (Signed)
Pt states he started having cough, congestion, body aches last night. He didn't take any meds for his sx.

## 2022-07-27 ENCOUNTER — Ambulatory Visit: Payer: Medicare PPO | Admitting: Internal Medicine

## 2022-08-11 ENCOUNTER — Ambulatory Visit: Payer: Medicare PPO | Admitting: Physician Assistant

## 2022-08-26 ENCOUNTER — Encounter: Payer: Self-pay | Admitting: Physician Assistant

## 2022-08-26 ENCOUNTER — Ambulatory Visit: Payer: Medicare PPO | Attending: Internal Medicine | Admitting: Physician Assistant

## 2022-08-26 VITALS — BP 140/90 | HR 66 | Wt 172.8 lb

## 2022-08-26 DIAGNOSIS — M159 Polyosteoarthritis, unspecified: Secondary | ICD-10-CM

## 2022-08-26 DIAGNOSIS — T7840XD Allergy, unspecified, subsequent encounter: Secondary | ICD-10-CM | POA: Diagnosis not present

## 2022-08-26 DIAGNOSIS — I10 Essential (primary) hypertension: Secondary | ICD-10-CM

## 2022-08-26 DIAGNOSIS — E785 Hyperlipidemia, unspecified: Secondary | ICD-10-CM | POA: Diagnosis not present

## 2022-08-26 DIAGNOSIS — R739 Hyperglycemia, unspecified: Secondary | ICD-10-CM | POA: Diagnosis not present

## 2022-08-26 DIAGNOSIS — M150 Primary generalized (osteo)arthritis: Secondary | ICD-10-CM

## 2022-08-26 MED ORDER — ALBUTEROL SULFATE HFA 108 (90 BASE) MCG/ACT IN AERS
1.0000 | INHALATION_SPRAY | Freq: Four times a day (QID) | RESPIRATORY_TRACT | 0 refills | Status: DC | PRN
Start: 2022-08-26 — End: 2022-11-23

## 2022-08-26 MED ORDER — AMLODIPINE BESYLATE 10 MG PO TABS: 10.0000 mg | ORAL_TABLET | Freq: Every day | ORAL | 6 refills | Status: DC

## 2022-08-26 MED ORDER — HYDROCHLOROTHIAZIDE 25 MG PO TABS: 25.0000 mg | ORAL_TABLET | Freq: Every day | ORAL | 1 refills | Status: DC

## 2022-08-26 MED ORDER — DULOXETINE HCL 20 MG PO CPEP: ORAL_CAPSULE | ORAL | 6 refills | Status: DC

## 2022-08-26 MED ORDER — MELOXICAM 15 MG PO TABS
15.0000 mg | ORAL_TABLET | Freq: Every day | ORAL | 1 refills | Status: DC
Start: 2022-08-26 — End: 2023-07-25

## 2022-08-26 MED ORDER — ROSUVASTATIN CALCIUM 20 MG PO TABS: 20.0000 mg | ORAL_TABLET | Freq: Every day | ORAL | 4 refills | Status: DC

## 2022-08-26 MED ORDER — FLUTICASONE PROPIONATE 50 MCG/ACT NA SUSP
2.0000 | Freq: Every day | NASAL | 2 refills | Status: DC
Start: 2022-08-26 — End: 2022-11-23

## 2022-08-26 NOTE — Progress Notes (Signed)
Patient ID: Albert Martin, male   DOB: 1951/03/25, 72 y.o.   MRN: [id number]   Albert Martin, is a 72 y.o. male  RAJ:[id number]  [id number]:[id number]  DOB - 1951-03-19  Chief Complaint  Patient presents with   Medication Refill       Subjective:   Albert Martin is a 72 y.o. male here today for med RF with uncertain compliance.  He has not been taking crestor.  He did not take BP meds today.  Needs RF.  No dizziness/HA/CP/SOB.  Still smoking cigs.  He has a BP cuff at home but does not know where it is and has not checked blood pressure outside of the office.    No problems updated.  ALLERGIES: No Known Allergies  PAST MEDICAL HISTORY: Past Medical History:  Diagnosis Date   Arthritis    Hyperlipidemia    Hypertension     MEDICATIONS AT HOME: Prior to Admission medications   Medication Sig Start Date End Date Taking? Authorizing Provider  benzonatate (TESSALON) 100 MG capsule Take 1 capsule (100 mg total) by mouth 3 (three) times daily as needed for cough. 06/25/22  Yes Banister, Janace Aris, MD  mometasone (NASONEX) 50 MCG/ACT nasal spray Place 2 sprays into the nose daily. 11/12/21  Yes Georgian Co M, PA-C  tadalafil (CIALIS) 20 MG tablet Take 1 tablet 1-2 hours before sexual activity(may take 1 tab in 3 days) 11/12/21  Yes Jermayne Sweeney, Marzella Schlein, PA-C  Testosterone Cypionate 150 MG/ML SOLN Inject 150 mg as directed every 14 (fourteen) days. 12/18/18  Yes Marcine Matar, MD  albuterol (VENTOLIN HFA) 108 (90 Base) MCG/ACT inhaler Inhale 1-2 puffs into the lungs every 6 (six) hours as needed for wheezing or shortness of breath. 08/26/22   Anders Simmonds, PA-C  amLODipine (NORVASC) 10 MG tablet Take 1 tablet (10 mg total) by mouth daily. 08/26/22   Anders Simmonds, PA-C  DULoxetine (CYMBALTA) 20 MG capsule TAKE 1 CAPSULE BY MOUTH EVERY DAY 08/26/22   Georgian Co M, PA-C  fluticasone (FLONASE) 50 MCG/ACT nasal spray Place 2 sprays into both nostrils daily. 08/26/22   Anders Simmonds, PA-C  hydrochlorothiazide (HYDRODIURIL) 25 MG tablet Take 1 tablet (25 mg total) by mouth daily. 08/26/22   Anders Simmonds, PA-C  meloxicam (MOBIC) 15 MG tablet Take 1 tablet (15 mg total) by mouth daily. 08/26/22   Anders Simmonds, PA-C  rosuvastatin (CRESTOR) 20 MG tablet Take 1 tablet (20 mg total) by mouth daily. 08/26/22   Aide Wojnar, Marzella Schlein, PA-C    ROS: Neg HEENT Neg resp Neg cardiac Neg GI Neg GU Neg MS Neg psych Neg neuro  Objective:   Vitals:   08/26/22 1544 08/26/22 1607  BP: (!) 144/89 (!) 140/90  Pulse: 66   SpO2: 95%   Weight: 172 lb 12.8 oz (78.4 kg)    Exam General appearance : Awake, alert, not in any distress. Speech Clear. Not toxic looking HEENT: Atraumatic and Normocephalic Neck: Supple, no JVD. No cervical lymphadenopathy.  Chest: Good air entry bilaterally, CTAB.  No rales/rhonchi/wheezing CVS: S1 S2 regular, no murmurs.  Extremities: B/L Lower Ext shows no edema, both legs are warm to touch Neurology: Awake alert, and oriented X 3, CN II-XII intact, Non focal Skin: No Rash  Data Review Lab Results  Component Value Date   HGBA1C 5.1 07/04/2006    Assessment & Plan   1. Essential hypertension Uncontrolled but did not take meds today - amLODipine (NORVASC) 10 MG tablet;  Take 1 tablet (10 mg total) by mouth daily.  Dispense: 30 tablet; Refill: 6 - hydrochlorothiazide (HYDRODIURIL) 25 MG tablet; Take 1 tablet (25 mg total) by mouth daily.  Dispense: 90 tablet; Refill: 1 - Basic metabolic panel  2. Primary osteoarthritis involving multiple joints Controlled on meds - DULoxetine (CYMBALTA) 20 MG capsule; TAKE 1 CAPSULE BY MOUTH EVERY DAY  Dispense: 30 capsule; Refill: 6 - meloxicam (MOBIC) 15 MG tablet; Take 1 tablet (15 mg total) by mouth daily.  Dispense: 90 tablet; Refill: 1  3. Hyperlipidemia, unspecified hyperlipidemia type Will likely need crestor even tho he has not been taking it.  I had started him on this 10/2021 -  rosuvastatin (CRESTOR) 20 MG tablet; Take 1 tablet (20 mg total) by mouth daily.  Dispense: 90 tablet; Refill: 4 - Lipid panel - Basic metabolic panel  4. Hyperglycemia - Hemoglobin A1c - Basic metabolic panel  5. Allergy, subsequent encounter - fluticasone (FLONASE) 50 MCG/ACT nasal spray; Place 2 sprays into both nostrils daily.  Dispense: 18.2 mL; Refill: 2 - albuterol (VENTOLIN HFA) 108 (90 Base) MCG/ACT inhaler; Inhale 1-2 puffs into the lungs every 6 (six) hours as needed for wheezing or shortness of breath.  Dispense: 18 g; Refill: 0    Return in about 4 months (around 12/26/2022) for Dr Laural Benes for chronic conditons.  The patient was given clear instructions to go to ER or return to medical center if symptoms don't improve, worsen or new problems develop. The patient verbalized understanding. The patient was told to call to get lab results if they haven't heard anything in the next week.      Georgian Co, PA-C [facility] [place], [place] [phone]   08/26/2022, 4:17 PM

## 2022-08-27 LAB — BASIC METABOLIC PANEL
BUN/Creatinine Ratio: 13 (ref 10–24)
BUN: 12 mg/dL (ref 8–27)
CO2: 22 mmol/L (ref 20–29)
Calcium: 9.1 mg/dL (ref 8.6–10.2)
Chloride: 104 mmol/L (ref 96–106)
Creatinine, Ser: 0.94 mg/dL (ref 0.76–1.27)
Glucose: 88 mg/dL (ref 70–99)
Potassium: 4 mmol/L (ref 3.5–5.2)
Sodium: 141 mmol/L (ref 134–144)
eGFR: 85 mL/min/{1.73_m2} (ref >59)

## 2022-08-27 LAB — LIPID PANEL
Chol/HDL Ratio: 4.5 ratio (ref 0.0–5.0)
Cholesterol, Total: 206 mg/dL — ABNORMAL HIGH (ref 100–199)
HDL: 46 mg/dL (ref >39)
LDL Chol Calc (NIH): 141 mg/dL — ABNORMAL HIGH (ref 0–99)
Triglycerides: 107 mg/dL (ref 0–149)
VLDL Cholesterol Cal: 19 mg/dL (ref 5–40)

## 2022-08-27 LAB — HEMOGLOBIN A1C
Est. average glucose Bld gHb Est-mCnc: 108 mg/dL
Hgb A1c MFr Bld: 5.4 % (ref 4.8–5.6)

## 2022-09-06 ENCOUNTER — Ambulatory Visit: Payer: Self-pay

## 2022-09-06 MED ORDER — PRAVASTATIN SODIUM 10 MG PO TABS
10.0000 mg | ORAL_TABLET | Freq: Every day | ORAL | 2 refills | Status: DC
Start: 2022-09-06 — End: 2022-11-23

## 2022-09-06 NOTE — Addendum Note (Signed)
Addended by: Jonah Blue B on: 09/06/2022 06:24 PM   Modules accepted: Orders

## 2022-09-06 NOTE — Telephone Encounter (Signed)
  Chief Complaint: Medication side effects Symptoms: Dizziness, nausea and sleeping through alarm Frequency: After starting Crestor Pertinent Negatives: Patient denies current s/s Disposition: ED /[] Urgent Care (no appt availability in office) / Appointment(In office/virtual)/  [facility]/ Home Care/ Refused Recommended Disposition /[] [facility]/  Follow-up with PCP Additional Notes: Pt called to tell provider that he was having side effects from Crestor. Pt states that he was having nausea, dizziness and was sleeping through his alarm in the morning. Pt discontinued the medication over the weekend and these symptoms have resolved.  Pt would like to know what other medication he can take for his cholesterol that would not cause these side effects.  PT would like email response.    Reason for Disposition  [1] Caller has URGENT medicine question about med that PCP or specialist prescribed AND [2] triager unable to answer question  Answer Assessment - Initial Assessment Questions 1. NAME of MEDICINE: "What medicine(s) are you calling about?"     Crestor 2. QUESTION: "What is your question?" (e.g., double dose of medicine, side effect)     Side effects 3. PRESCRIBER: "Who prescribed the medicine?" Reason: if prescribed by specialist, call should be referred to that group.     Georgian Co 4. SYMPTOMS: "Do you have any symptoms?" If Yes, ask: "What symptoms are you having?"  "How bad are the symptoms (e.g., mild, moderate, severe)     Dizziness, Nausea, sleeping through the alarm  Protocols used: Medication Question Call-A-AH

## 2022-09-07 ENCOUNTER — Other Ambulatory Visit: Payer: Self-pay | Admitting: Internal Medicine

## 2022-09-07 NOTE — Telephone Encounter (Signed)
Spoke with patient . Verified name & DOB  patient advised he should stop the Crestor .PCP Changed him to Pravachol 10 mg Prescription sent to [facility]. Patient voiced understanding of all discussed .

## 2022-09-07 NOTE — Telephone Encounter (Signed)
Requested Prescriptions  Refused Prescriptions Disp Refills   pravastatin (PRAVACHOL) 10 MG tablet [Pharmacy Med Name: pravastatin 10 mg tablet] 30 tablet 2    Sig: Take 1 tablet by mouth daily. Stop Crestor     Cardiovascular:  Antilipid - Statins Failed - 09/07/2022  9:15 AM      Failed - Lipid Panel in normal range within the last 12 months    Cholesterol, Total  Date Value Ref Range Status  08/26/2022 206 (H) 100 - 199 mg/dL Final   LDL Chol Calc (NIH)  Date Value Ref Range Status  08/26/2022 141 (H) 0 - 99 mg/dL Final   HDL  Date Value Ref Range Status  08/26/2022 46 >39 mg/dL Final   Triglycerides  Date Value Ref Range Status  08/26/2022 107 0 - 149 mg/dL Final         Passed - Patient is not pregnant      Passed - Valid encounter within last 12 months    Recent Outpatient Visits           1 week ago Hyperglycemia   [facility] [place], [place], [place]   9 months ago Erectile dysfunction, unspecified erectile dysfunction type   [facility] [place], [place], [place]   1 year ago Acute bacterial bronchitis   [facility], [place], [place]   1 year ago Encounter for [employer] annual wellness exam   [facility] Marcine Matar, MD   2 years ago Right inguinal hernia   [facility] Marcine Matar, MD       Future Appointments             In 3 months Laural Benes, Binnie Rail, MD [facility]

## 2022-10-02 DIAGNOSIS — T50905A Adverse effect of unspecified drugs, medicaments and biological substances, initial encounter: Secondary | ICD-10-CM | POA: Diagnosis not present

## 2022-10-02 DIAGNOSIS — R110 Nausea: Secondary | ICD-10-CM | POA: Diagnosis not present

## 2022-10-21 ENCOUNTER — Ambulatory Visit: Payer: Self-pay

## 2022-10-21 NOTE — Telephone Encounter (Signed)
Second attempt to reach pt. Left message to call back. 

## 2022-10-21 NOTE — Telephone Encounter (Signed)
Patient called, left VM to return the call to the office to speak to NT.   Summary: medication change request   Patient states that he had a reaction to pravastatin (PRAVACHOL) 10 MG tablet that mad him got to UC two weeks ago. Patient is requesting a cholesterol medication that is not in the statin class.

## 2022-10-21 NOTE — Telephone Encounter (Signed)
3rd attempt, Patient called, left VM to return the call to the office to speak to the NT.   Summary: medication change request   Patient states that he had a reaction to pravastatin (PRAVACHOL) 10 MG tablet that mad him got to UC two weeks ago. Patient is requesting a cholesterol medication that is not in the statin class.

## 2022-10-25 NOTE — Patient Instructions (Incomplete)
Albert Martin , Thank you for taking time to come for your Medicare Wellness Visit. I appreciate your ongoing commitment to your health goals. Please review the following plan we discussed and let me know if I can assist you in the future.   These are the goals we discussed:  Goals   None     This is a list of the screening recommended for you and due dates:  Health Maintenance  Topic Date Due   Colon Cancer Screening  12/14/2021   COVID-19 Vaccine (4 - 2023-24 season) 01/15/2022   Medicare Annual Wellness Visit  02/23/2022   Flu Shot  12/16/2022   DTaP/Tdap/Td vaccine (5 - Td or Tdap) 10/16/2030   Pneumonia Vaccine  Completed   Hepatitis C Screening  Completed   Zoster (Shingles) Vaccine  Completed   HPV Vaccine  Aged Out    Advanced directives: Information on Advanced Care Planning can be found at [facility] [facility] Advance Health Care Directives Advance Health Care Directives ([email or web address] Please bring a copy of your health care power of attorney and living will to the office to be added to your chart at your convenience.  Conditions/risks identified: Aim for 30 minutes of exercise or brisk walking, 6-8 glasses of water, and 5 servings of fruits and vegetables each day.  Next appointment: Follow up in one year for your annual wellness visit.   Preventive Care 29 Years and Older, Male  Preventive care refers to lifestyle choices and visits with your health care provider that can promote health and wellness. What does preventive care include? A yearly physical exam. This is also called an annual well check. Dental exams once or twice a year. Routine eye exams. Ask your health care provider how often you should have your eyes checked. Personal lifestyle choices, including: Daily care of your teeth and gums. Regular physical activity. Eating a healthy diet. Avoiding tobacco and drug use. Limiting alcohol use. Practicing safe sex. Taking low doses of aspirin every  day. Taking vitamin and mineral supplements as recommended by your health care provider. What happens during an annual well check? The services and screenings done by your health care provider during your annual well check will depend on your age, overall health, lifestyle risk factors, and family history of disease. Counseling  Your health care provider may ask you questions about your: Alcohol use. Tobacco use. Drug use. Emotional well-being. Home and relationship well-being. Sexual activity. Eating habits. History of falls. Memory and ability to understand (cognition). Work and work Astronomer. Screening  You may have the following tests or measurements: Height, weight, and BMI. Blood pressure. Lipid and cholesterol levels. These may be checked every 5 years, or more frequently if you are over 87 years old. Skin check. Lung cancer screening. You may have this screening every year starting at age 66 if you have a 30-pack-year history of smoking and currently smoke or have quit within the past 15 years. Fecal occult blood test (FOBT) of the stool. You may have this test every year starting at age 19. Flexible sigmoidoscopy or colonoscopy. You may have a sigmoidoscopy every 5 years or a colonoscopy every 10 years starting at age 53. Prostate cancer screening. Recommendations will vary depending on your family history and other risks. Hepatitis C blood test. Hepatitis B blood test. Sexually transmitted disease (STD) testing. Diabetes screening. This is done by checking your blood sugar (glucose) after you have not eaten for a while (fasting). You may have this done  every 1-3 years. Abdominal aortic aneurysm (AAA) screening. You may need this if you are a current or former smoker. Osteoporosis. You may be screened starting at age 35 if you are at high risk. Talk with your health care provider about your test results, treatment options, and if necessary, the need for more  tests. Vaccines  Your health care provider may recommend certain vaccines, such as: Influenza vaccine. This is recommended every year. Tetanus, diphtheria, and acellular pertussis (Tdap, Td) vaccine. You may need a Td booster every 10 years. Zoster vaccine. You may need this after age 34. Pneumococcal 13-valent conjugate (PCV13) vaccine. One dose is recommended after age 29. Pneumococcal polysaccharide (PPSV23) vaccine. One dose is recommended after age 67. Talk to your health care provider about which screenings and vaccines you need and how often you need them. This information is not intended to replace advice given to you by your health care provider. Make sure you discuss any questions you have with your health care provider. Document Released: 05/30/2015 Document Revised: 01/21/2016 Document Reviewed: 03/04/2015 Elsevier Interactive Patient Education  2017 [employer].  Fall Prevention in the Home Falls can cause injuries. They can happen to people of all ages. There are many things you can do to make your home safe and to help prevent falls. What can I do on the outside of my home? Regularly fix the edges of walkways and driveways and fix any cracks. Remove anything that might make you trip as you walk through a door, such as a raised step or threshold. Trim any bushes or trees on the path to your home. Use bright outdoor lighting. Clear any walking paths of anything that might make someone trip, such as rocks or tools. Regularly check to see if handrails are loose or broken. Make sure that both sides of any steps have handrails. Any raised decks and porches should have guardrails on the edges. Have any leaves, snow, or ice cleared regularly. Use sand or salt on walking paths during winter. Clean up any spills in your garage right away. This includes oil or grease spills. What can I do in the bathroom? Use night lights. Install grab bars by the toilet and in the tub and shower.  Do not use towel bars as grab bars. Use non-skid mats or decals in the tub or shower. If you need to sit down in the shower, use a plastic, non-slip stool. Keep the floor dry. Clean up any water that spills on the floor as soon as it happens. Remove soap buildup in the tub or shower regularly. Attach bath mats securely with double-sided non-slip rug tape. Do not have throw rugs and other things on the floor that can make you trip. What can I do in the bedroom? Use night lights. Make sure that you have a light by your bed that is easy to reach. Do not use any sheets or blankets that are too big for your bed. They should not hang down onto the floor. Have a firm chair that has side arms. You can use this for support while you get dressed. Do not have throw rugs and other things on the floor that can make you trip. What can I do in the kitchen? Clean up any spills right away. Avoid walking on wet floors. Keep items that you use a lot in easy-to-reach places. If you need to reach something above you, use a strong step stool that has a grab bar. Keep electrical cords out of the  way. Do not use floor polish or wax that makes floors slippery. If you must use wax, use non-skid floor wax. Do not have throw rugs and other things on the floor that can make you trip. What can I do with my stairs? Do not leave any items on the stairs. Make sure that there are handrails on both sides of the stairs and use them. Fix handrails that are broken or loose. Make sure that handrails are as long as the stairways. Check any carpeting to make sure that it is firmly attached to the stairs. Fix any carpet that is loose or worn. Avoid having throw rugs at the top or bottom of the stairs. If you do have throw rugs, attach them to the floor with carpet tape. Make sure that you have a light switch at the top of the stairs and the bottom of the stairs. If you do not have them, ask someone to add them for you. What else  can I do to help prevent falls? Wear shoes that: Do not have high heels. Have rubber bottoms. Are comfortable and fit you well. Are closed at the toe. Do not wear sandals. If you use a stepladder: Make sure that it is fully opened. Do not climb a closed stepladder. Make sure that both sides of the stepladder are locked into place. Ask someone to hold it for you, if possible. Clearly mark and make sure that you can see: Any grab bars or handrails. First and last steps. Where the edge of each step is. Use tools that help you move around (mobility aids) if they are needed. These include: Canes. Walkers. Scooters. Crutches. Turn on the lights when you go into a dark area. Replace any light bulbs as soon as they burn out. Set up your furniture so you have a clear path. Avoid moving your furniture around. If any of your floors are uneven, fix them. If there are any pets around you, be aware of where they are. Review your medicines with your doctor. Some medicines can make you feel dizzy. This can increase your chance of falling. Ask your doctor what other things that you can do to help prevent falls. This information is not intended to replace advice given to you by your health care provider. Make sure you discuss any questions you have with your health care provider. Document Released: 02/27/2009 Document Revised: 10/09/2015 Document Reviewed: 06/07/2014 Elsevier Interactive Patient Education  2017 [employer].

## 2022-10-25 NOTE — Progress Notes (Unsigned)
Subjective:   Albert Martin is a 72 y.o. male who presents for Medicare Annual/Subsequent preventive examination.  Review of Systems    ***       Objective:    There were no vitals filed for this visit. There is no height or weight on file to calculate BMI.     02/23/2021   10:27 AM 01/28/2015    3:27 PM  Advanced Directives  Does Patient Have a Medical Advance Directive? No No  Would patient like information on creating a medical advance directive? Yes (Inpatient - patient defers creating a medical advance directive at this time - Information given)     Current Medications (verified) Outpatient Encounter Medications as of 10/26/2022  Medication Sig   albuterol (VENTOLIN HFA) 108 (90 Base) MCG/ACT inhaler Inhale 1-2 puffs into the lungs every 6 (six) hours as needed for wheezing or shortness of breath.   amLODipine (NORVASC) 10 MG tablet Take 1 tablet (10 mg total) by mouth daily.   benzonatate (TESSALON) 100 MG capsule Take 1 capsule (100 mg total) by mouth 3 (three) times daily as needed for cough.   DULoxetine (CYMBALTA) 20 MG capsule TAKE 1 CAPSULE BY MOUTH EVERY DAY   fluticasone (FLONASE) 50 MCG/ACT nasal spray Place 2 sprays into both nostrils daily.   hydrochlorothiazide (HYDRODIURIL) 25 MG tablet Take 1 tablet (25 mg total) by mouth daily.   meloxicam (MOBIC) 15 MG tablet Take 1 tablet (15 mg total) by mouth daily.   mometasone (NASONEX) 50 MCG/ACT nasal spray Place 2 sprays into the nose daily.   pravastatin (PRAVACHOL) 10 MG tablet Take 1 tablet (10 mg total) by mouth daily. Stop Crestor   tadalafil (CIALIS) 20 MG tablet Take 1 tablet 1-2 hours before sexual activity(may take 1 tab in 3 days)   Testosterone Cypionate 150 MG/ML SOLN Inject 150 mg as directed every 14 (fourteen) days.   Facility-Administered Encounter Medications as of 10/26/2022  Medication   0.9 %  sodium chloride infusion    Allergies (verified) Patient has no known allergies.    History: Past Medical History:  Diagnosis Date   Arthritis    Hyperlipidemia    Hypertension    Past Surgical History:  Procedure Laterality Date   INGUINAL HERNIA REPAIR     WISDOM TOOTH EXTRACTION     Family History  Problem Relation Age of Onset   Lung cancer Mother    Hypertension Mother    Arthritis Mother    Esophageal cancer Mother    Hypertension Father    Arthritis Father    Hypertension Brother    Arthritis Brother    Lung cancer Maternal Aunt    Colon cancer Neg Hx    Colon polyps Neg Hx    Rectal cancer Neg Hx    Stomach cancer Neg Hx    Social History   Socioeconomic History   Marital status: Married    Spouse name: Not on file   Number of children: 4   Years of education: Not on file   Highest education level: Not on file  Occupational History   Not on file  Tobacco Use   Smoking status: Every Day    Packs/day: 0.25    Years: 10.00    Additional pack years: 0.00    Total pack years: 2.50    Types: Cigarettes    Last attempt to quit: 08/15/2009    Years since quitting: 13.2   Smokeless tobacco: Never  Vaping Use   Vaping  Use: Never used  Substance and Sexual Activity   Alcohol use: No    Alcohol/week: 0.0 standard drinks of alcohol   Drug use: No   Sexual activity: Yes  Other Topics Concern   Not on file  Social History Narrative   Not on file   Social Determinants of Health   Financial Resource Strain: Not on file  Food Insecurity: Not on file  Transportation Needs: Not on file  Physical Activity: Not on file  Stress: Not on file  Social Connections: Not on file    Tobacco Counseling Ready to quit: Not Answered Counseling given: Not Answered   Clinical Intake:                 Diabetic?No          Activities of Daily Living     No data to display          Patient Care Team: Marcine Matar, MD as PCP - General (Internal Medicine)  Indicate any recent Medical Services you may have received from  other than [facility] providers in the past year (date may be approximate).     Assessment:   This is a routine wellness examination for [name].  Hearing/Vision screen No results found.  Dietary issues and exercise activities discussed:     Goals Addressed   None    Depression Screen    08/26/2022    3:48 PM 11/12/2021    8:58 AM 09/02/2021    3:21 PM 02/23/2021   10:23 AM 04/09/2020    2:43 PM 07/13/2019    9:04 AM 09/15/2018   11:39 AM  PHQ 2/9 Scores  PHQ - 2 Score 0 0 0 0 0 0 0  PHQ- 9 Score  0 2        Fall Risk    08/26/2022    3:47 PM 02/23/2021   10:23 AM 05/05/2020    2:04 PM 04/09/2020    2:43 PM 07/13/2019    9:04 AM  Fall Risk   Falls in the past year? 0 0 0 0 0  Number falls in past yr: 0 0 0 0   Injury with Fall? 0 0 0 0   Risk for fall due to : No Fall Risks No Fall Risks       FALL RISK PREVENTION PERTAINING TO THE HOME:  Any stairs in or around the home? {YES/NO:21197} If so, are there any without handrails? {YES/NO:21197} Home free of loose throw rugs in walkways, pet beds, electrical cords, etc? {YES/NO:21197} Adequate lighting in your home to reduce risk of falls? {YES/NO:21197}  ASSISTIVE DEVICES UTILIZED TO PREVENT FALLS:  Life alert? {YES/NO:21197} Use of a cane, walker or w/c? {YES/NO:21197} Grab bars in the bathroom? {YES/NO:21197} Shower chair or bench in shower? {YES/NO:21197} Elevated toilet seat or a handicapped toilet? {YES/NO:21197}  TIMED UP AND GO:  Was the test performed? No . Telephonic visit   Cognitive Function:    02/23/2021   10:28 AM  MMSE - Mini Mental State Exam  Orientation to time 5  Orientation to Place 5  Registration 3  Attention/ Calculation 5  Recall 3  Language- name 2 objects 2  Language- repeat 1  Language- follow 3 step command 3  Language- read & follow direction 1  Write a sentence 1  Copy design 0  Total score 29        Immunizations Immunization History  Administered Date(s) Administered    DTaP 09/18/2018   Influenza Whole  02/02/2007   Influenza-Unspecified 01/30/2021   PFIZER(Purple Top)SARS-COV-2 Vaccination 07/07/2019, 07/31/2019, 10/15/2020   PNEUMOCOCCAL CONJUGATE-20 10/15/2020   Pneumococcal Conjugate-13 09/18/2018   Td 05/18/1995, 10/15/2020   Tdap 10/15/2020   Zoster Recombinat (Shingrix) 10/15/2020, 01/30/2021    TDAP status: Up to date  Pneumococcal vaccine status: Up to date  Covid-19 vaccine status: Information provided on how to obtain vaccines.   Qualifies for Shingles Vaccine? Yes   Zostavax completed No   Shingrix Completed?: Yes  Screening Tests Health Maintenance  Topic Date Due   Colonoscopy  12/14/2021   COVID-19 Vaccine (4 - 2023-24 season) 01/15/2022   Medicare Annual Wellness (AWV)  02/23/2022   INFLUENZA VACCINE  12/16/2022   DTaP/Tdap/Td (5 - Td or Tdap) 10/16/2030   Pneumonia Vaccine 72+ Years old  Completed   Hepatitis C Screening  Completed   Zoster Vaccines- Shingrix  Completed   HPV VACCINES  Aged Out    Health Maintenance  Health Maintenance Due  Topic Date Due   Colonoscopy  12/14/2021   COVID-19 Vaccine (4 - 2023-24 season) 01/15/2022   Medicare Annual Wellness (AWV)  02/23/2022    Colorectal cancer screening: Type of screening: Colonoscopy. Completed 12/15/18. Repeat every 3 years  Lung Cancer Screening: (Low Dose CT Chest recommended if Age 67-80 years, 30 pack-year currently smoking OR have quit w/in 15years.) does not qualify.   Lung Cancer Screening Referral: n/a  Additional Screening:  Hepatitis C Screening: does qualify; Completed 02/23/21  Vision Screening: Recommended annual ophthalmology exams for early detection of glaucoma and other disorders of the eye. Is the patient up to date with their annual eye exam?  {YES/NO:21197} Who is the provider or what is the name of the office in which the patient attends annual eye exams? *** If pt is not established with a provider, would they like to be referred  to a provider to establish care? {YES/NO:21197}.   Dental Screening: Recommended annual dental exams for proper oral hygiene  Community Resource Referral / Chronic Care Management: CRR required this visit?  {YES/NO:21197}  CCM required this visit?  {YES/NO:21197}     Plan:     I have personally reviewed and noted the following in the patient's chart:   Medical and social history Use of alcohol, tobacco or illicit drugs  Current medications and supplements including opioid prescriptions. {Opioid Prescriptions:7705446997} Functional ability and status Nutritional status Physical activity Advanced directives List of other physicians Hospitalizations, surgeries, and ER visits in previous 12 months Vitals Screenings to include cognitive, depression, and falls Referrals and appointments  In addition, I have reviewed and discussed with patient certain preventive protocols, quality metrics, and best practice recommendations. A written personalized care plan for preventive services as well as general preventive health recommendations were provided to patient.     Durwin Nora, [place]   1/61/0960   Due to this being a virtual visit, the after visit summary with patients personalized plan was offered to patient via mail or my-chart. ***Patient declined at this time./ Patient would like to access on my-chart/ per request, patient was mailed a copy of AVS./ Patient preferred to pick up at office at next visit  Nurse Notes: ***

## 2022-10-26 ENCOUNTER — Ambulatory Visit: Payer: Medicare PPO | Attending: Internal Medicine

## 2022-10-26 VITALS — Ht 71.0 in | Wt 172.0 lb

## 2022-10-26 DIAGNOSIS — Z0000 Encounter for general adult medical examination without abnormal findings: Secondary | ICD-10-CM

## 2022-11-23 ENCOUNTER — Other Ambulatory Visit: Payer: Self-pay

## 2022-11-23 ENCOUNTER — Encounter (HOSPITAL_COMMUNITY): Payer: Self-pay | Admitting: *Deleted

## 2022-11-23 ENCOUNTER — Other Ambulatory Visit: Payer: Self-pay | Admitting: Physician Assistant

## 2022-11-23 ENCOUNTER — Ambulatory Visit (HOSPITAL_COMMUNITY)
Admission: EM | Admit: 2022-11-23 | Discharge: 2022-11-23 | Disposition: A | Discharge status: Home / Self Care | Payer: Medicare PPO | Attending: Emergency Medicine | Admitting: Emergency Medicine

## 2022-11-23 DIAGNOSIS — R519 Headache, unspecified: Secondary | ICD-10-CM

## 2022-11-23 DIAGNOSIS — I10 Essential (primary) hypertension: Secondary | ICD-10-CM

## 2022-11-23 DIAGNOSIS — M159 Polyosteoarthritis, unspecified: Secondary | ICD-10-CM

## 2022-11-23 DIAGNOSIS — K529 Noninfective gastroenteritis and colitis, unspecified: Secondary | ICD-10-CM

## 2022-11-23 MED ORDER — ONDANSETRON 4 MG PO TBDP: 4.0000 mg | ORAL_TABLET | Freq: Four times a day (QID) | ORAL | 0 refills | Status: DC | PRN

## 2022-11-23 NOTE — Discharge Instructions (Addendum)
The nausea medicine (zofran) can be used every 6 hours as needed. Tablet dissolves under the tongue. Make sure you are increasing fluid intake.   Use tylenol every 6 hours as needed to help headache or any pain/fever  Do not use other NSAIDs with your meloxicam. This includes ibuprofen/Advil, naproxen/Aleve

## 2022-11-23 NOTE — ED Provider Notes (Signed)
[facility]    CSN: [id number] Arrival date & time: 11/23/22  7829      History   Chief Complaint Chief Complaint  Patient presents with   Nausea   Headache   Fever    HPI Albert Martin is a 72 y.o. male.  Yesterday he had some nausea and 1 episode of vomiting. A little abdominal discomfort that resolved.  Tolerating fluids and normal appetite.  No diarrhea or constipation.  No fevers. Headache last night improved on its own, now rated 3/10. Reports a stomach bug going around his job Needs note to return tonight   Takes meloxicam daily for arthritis Thought he could take ibuprofen with this medicine   Past Medical History:  Diagnosis Date   Arthritis    Hyperlipidemia    Hypertension     Patient Active Problem List   Diagnosis Date Noted   Positive hepatitis C antibody test 06/09/2021   Erythrocytosis 12/18/2018   History of colon polyps 09/15/2018   Low testosterone level in male 09/15/2018   Erectile dysfunction 09/15/2018   Primary osteoarthritis involving multiple joints 09/15/2018   Hyperlipidemia 05/26/2007   ERECTILE DYSFUNCTION 05/26/2007   TOBACCO ABUSE 05/26/2007   HEMATURIA UNSPECIFIED 05/26/2007   OSTEOARTHRITIS 05/26/2007   ARTHRITIS, SHOULDERS, BILATERAL 05/26/2007   ARTHRITIS, BACK 05/26/2007   Essential hypertension 07/05/2006    Past Surgical History:  Procedure Laterality Date   INGUINAL HERNIA REPAIR     WISDOM TOOTH EXTRACTION         Home Medications    Prior to Admission medications   Medication Sig Start Date End Date Taking? Authorizing Provider  amLODipine (NORVASC) 10 MG tablet Take 1 tablet (10 mg total) by mouth daily. 08/26/22  Yes Georgian Co M, PA-C  hydrochlorothiazide (HYDRODIURIL) 25 MG tablet Take 1 tablet (25 mg total) by mouth daily. 08/26/22  Yes McClung, Marzella Schlein, PA-C  meloxicam (MOBIC) 15 MG tablet Take 1 tablet (15 mg total) by mouth daily. 08/26/22  Yes Georgian Co M, PA-C  ondansetron  (ZOFRAN-ODT) 4 MG disintegrating tablet Take 1 tablet (4 mg total) by mouth every 6 (six) hours as needed for nausea or vomiting. 11/23/22  Yes Kelia Gibbon, Lurena Joiner, PA-C  tadalafil (CIALIS) 20 MG tablet Take 1 tablet 1-2 hours before sexual activity(may take 1 tab in 3 days) 11/12/21  Yes McClung, Marzella Schlein, PA-C  Testosterone Cypionate 150 MG/ML SOLN Inject 150 mg as directed every 14 (fourteen) days. 12/18/18  Yes Marcine Matar, MD    Family History Family History  Problem Relation Age of Onset   Lung cancer Mother    Hypertension Mother    Arthritis Mother    Esophageal cancer Mother    Hypertension Father    Arthritis Father    Hypertension Brother    Arthritis Brother    Lung cancer Maternal Aunt    Colon cancer Neg Hx    Colon polyps Neg Hx    Rectal cancer Neg Hx    Stomach cancer Neg Hx     Social History Social History   Tobacco Use   Smoking status: Every Day    Packs/day: 0.25    Years: 10.00    Additional pack years: 0.00    Total pack years: 2.50    Types: Cigarettes    Last attempt to quit: 08/15/2009    Years since quitting: 13.2   Smokeless tobacco: Never  Vaping Use   Vaping Use: Never used  Substance Use Topics  Alcohol use: No    Alcohol/week: 0.0 standard drinks of alcohol   Drug use: No     Allergies   Statins   Review of Systems Review of Systems Per HPI  Physical Exam Triage Vital Signs ED Triage Vitals  Enc Vitals Group     BP 11/23/22 0917 (!) 167/88     Pulse Rate 11/23/22 0917 60     Resp 11/23/22 0917 18     Temp 11/23/22 0917 98.6 F (37 C)     Temp src --      SpO2 11/23/22 0917 99 %     Weight --      Height --      Head Circumference --      Peak Flow --      Pain Score 11/23/22 0919 6     Pain Loc --      Pain Edu? --      Excl. in GC? --    No data found.  Updated Vital Signs BP (!) 167/88   Pulse 60   Temp 98.6 F (37 C)   Resp 18   SpO2 99%    Physical Exam Vitals and nursing note reviewed.   Constitutional:      Appearance: Normal appearance.  HENT:     Mouth/Throat:     Mouth: Mucous membranes are moist.     Pharynx: Oropharynx is clear. No posterior oropharyngeal erythema.  Eyes:     Conjunctiva/sclera: Conjunctivae normal.  Cardiovascular:     Rate and Rhythm: Normal rate and regular rhythm.     Heart sounds: Normal heart sounds.  Pulmonary:     Effort: Pulmonary effort is normal.     Breath sounds: Normal breath sounds.  Abdominal:     General: Bowel sounds are normal.     Palpations: Abdomen is soft.     Tenderness: There is no abdominal tenderness. There is no right CVA tenderness, left CVA tenderness, guarding or rebound.  Musculoskeletal:        General: Normal range of motion.  Skin:    General: Skin is warm and dry.  Neurological:     Mental Status: He is alert and oriented to person, place, and time.      UC Treatments / Results  Labs (all labs ordered are listed, but only abnormal results are displayed) Labs Reviewed - No data to display  EKG   Radiology No results found.  Procedures Procedures (including critical care time)  Medications Ordered in UC Medications - No data to display  Initial Impression / Assessment and Plan / UC Course  I have reviewed the triage vital signs and the nursing notes.  Pertinent labs & imaging results that were available during my care of the patient were reviewed by me and considered in my medical decision making (see chart for details).  Afebrile, well-appearing, belly nontender.  Nausea and headache have overall resolved.  Drinking fluids and eating normally. Sent Zofran to use if needed.  Discussed not using other NSAIDs with meloxicam.  Can take Tylenol.  Provided work note.  Patient agreeable to plan, no questions at this time  Final Clinical Impressions(s) / UC Diagnoses   Final diagnoses:  Gastroenteritis  Mild headache     Discharge Instructions      The nausea medicine (zofran) can be  used every 6 hours as needed. Tablet dissolves under the tongue. Make sure you are increasing fluid intake.   Use tylenol every 6 hours as needed  to help headache or any pain/fever  Do not use other NSAIDs with your meloxicam. This includes ibuprofen/Advil, naproxen/Aleve     ED Prescriptions     Medication Sig Dispense Auth. Provider   ondansetron (ZOFRAN-ODT) 4 MG disintegrating tablet Take 1 tablet (4 mg total) by mouth every 6 (six) hours as needed for nausea or vomiting. 20 tablet Zaydan Papesh, Lurena Joiner, PA-C      PDMP not reviewed this encounter.   Marlow Baars, [place] 11/23/22 9147

## 2022-11-23 NOTE — ED Triage Notes (Signed)
Pt reports for 2 days he has had nausea,HA and fever .Pt reported he vomited x1.

## 2022-12-09 ENCOUNTER — Ambulatory Visit: Payer: Medicare PPO | Admitting: Internal Medicine

## 2023-01-12 ENCOUNTER — Other Ambulatory Visit: Payer: Self-pay | Admitting: Pharmacist

## 2023-01-12 NOTE — Progress Notes (Signed)
Pharmacy Quality Measure Review  This patient is appearing on a report for being at risk of failing the adherence measure for cholesterol (statin) medications this calendar year.   Medication: pravastatin Last fill date: 09/27/2022 for 60 day supply  Patient was taken off of pravastatin. He was switched from rosuvastatin to pravastatin in 08/2022 d/t dizziness, nausea. He endorsed similar reaction with pravastatin and this was stopped. He is not on a statin at this time.   Butch Penny, PharmD, [place], CPP Clinical Pharmacist [facility] [phone]

## 2023-03-14 ENCOUNTER — Ambulatory Visit: Payer: Medicare PPO | Admitting: Internal Medicine

## 2023-05-03 ENCOUNTER — Other Ambulatory Visit: Payer: Self-pay | Admitting: Physician Assistant

## 2023-05-03 DIAGNOSIS — M150 Primary generalized (osteo)arthritis: Secondary | ICD-10-CM

## 2023-05-20 DIAGNOSIS — H524 Presbyopia: Secondary | ICD-10-CM | POA: Diagnosis not present

## 2023-05-20 DIAGNOSIS — H5203 Hypermetropia, bilateral: Secondary | ICD-10-CM | POA: Diagnosis not present

## 2023-05-20 DIAGNOSIS — I10 Essential (primary) hypertension: Secondary | ICD-10-CM | POA: Diagnosis not present

## 2023-05-20 DIAGNOSIS — E7801 Familial hypercholesterolemia: Secondary | ICD-10-CM | POA: Diagnosis not present

## 2023-05-26 ENCOUNTER — Ambulatory Visit: Payer: Medicare PPO | Admitting: Internal Medicine

## 2023-07-01 ENCOUNTER — Other Ambulatory Visit: Payer: Self-pay | Admitting: Internal Medicine

## 2023-07-01 DIAGNOSIS — N529 Male erectile dysfunction, unspecified: Secondary | ICD-10-CM

## 2023-07-01 DIAGNOSIS — Z23 Encounter for immunization: Secondary | ICD-10-CM | POA: Diagnosis not present

## 2023-07-01 NOTE — Telephone Encounter (Signed)
Copied from CRM [id number]. Topic: Clinical - Medication Refill >> Jul 01, 2023  1:10 PM Yolanda T wrote: Most Recent Primary Care Visit:  Provider: Anthoney Harada  Department: [facility]  Visit Type: MEDICARE AWV, SEQUENTIAL  Date: 10/26/2022  Medication:  tadalafil (CIALIS) 20 MG tablet     Has the patient contacted their pharmacy? No  Is this the correct pharmacy for this prescription? Yes   This is the patient's preferred pharmacy:   [facility] [place], [place] - [address] [place] [address] Phone: [phone] Fax: [phone]  Has the prescription been filled recently? Yes  Is the patient out of the medication? Yes  Has the patient been seen for an appointment in the last year OR does the patient have an upcoming appointment? Yes  Can we respond through MyChart? Yes  Agent: Please be advised that Rx refills may take up to 3 business days. We ask that you follow-up with your pharmacy.

## 2023-07-01 NOTE — Telephone Encounter (Signed)
Last Fill: 11/12/21  Last OV: 10/26/22 Next OV: 07/25/23  Routing to provider for review/authorization.

## 2023-07-02 MED ORDER — TADALAFIL 20 MG PO TABS
ORAL_TABLET | ORAL | 0 refills | Status: DC
Start: 2023-07-02 — End: 2023-07-25

## 2023-07-06 ENCOUNTER — Other Ambulatory Visit: Payer: Self-pay | Admitting: Internal Medicine

## 2023-07-06 DIAGNOSIS — M150 Primary generalized (osteo)arthritis: Secondary | ICD-10-CM

## 2023-07-08 ENCOUNTER — Other Ambulatory Visit: Payer: Self-pay | Admitting: Internal Medicine

## 2023-07-08 DIAGNOSIS — N529 Male erectile dysfunction, unspecified: Secondary | ICD-10-CM

## 2023-07-08 NOTE — Telephone Encounter (Signed)
 Copied from CRM [id number]. Topic: Clinical - Medication Refill >> Jul 08, 2023  1:49 PM Carlatta H wrote: Most Recent Primary Care Visit:  Provider: Anthoney Harada  Department: [facility]  Visit Type: MEDICARE AWV, SEQUENTIAL  Date: 10/26/2022  Medication: tadalafil (CIALIS) 20 MG tablet [[id number]]  Has the patient contacted their pharmacy? No (Agent: If no, request that the patient contact the pharmacy for the refill. If patient does not wish to contact the pharmacy document the reason why and proceed with request.) (Agent: If yes, when and what did the pharmacy advise?)  Is this the correct pharmacy for this prescription? Yes If no, delete pharmacy and type the correct one.  This is the patient's preferred pharmacy:    [facility] [place], [place] - [address] [place] [address] Phone: [phone] Fax: [phone]   Has the prescription been filled recently? No  Is the patient out of the medication? Yes  Has the patient been seen for an appointment in the last year OR does the patient have an upcoming appointment? Yes  Can we respond through MyChart? No  Agent: Please be advised that Rx refills may take up to 3 business days. We ask that you follow-up with your pharmacy.

## 2023-07-11 NOTE — Telephone Encounter (Signed)
 Requested medication (s) are due for refill today: routing for review  Requested medication (s) are on the active medication list: yes  Last refill:  07/02/23  Future visit scheduled: yes  Notes to clinic:  routing for review     Requested Prescriptions  Pending Prescriptions Disp Refills   tadalafil (CIALIS) 20 MG tablet 30 tablet 0    Sig: Take 1 tablet 1-2 hours before sexual activity(may take 1 tab in 3 days)     Urology: Erectile Dysfunction Agents Failed - 07/11/2023 10:27 AM      Failed - AST in normal range and within 360 days    AST  Date Value Ref Range Status  11/12/2021 11 0 - 40 IU/L Final         Failed - ALT in normal range and within 360 days    ALT  Date Value Ref Range Status  11/12/2021 14 0 - 44 IU/L Final         Failed - Last BP in normal range    BP Readings from Last 1 Encounters:  11/23/22 (!) 167/88         Passed - Valid encounter within last 12 months    Recent Outpatient Visits           10 months ago Hyperglycemia   [facility] [place]. [facility], Marzella Schlein, [place]   1 year ago Erectile dysfunction, unspecified erectile dysfunction type   [facility] [place]. [facility] [place], Marzella Schlein, [place]   1 year ago Acute bacterial bronchitis   [facility], Kasandra Knudsen, [place]   2 years ago Encounter for [employer] annual wellness exam   [facility] [facility] [place] [facility] [place]. [facility] Marcine Matar, [place]   3 years ago Right inguinal hernia    [facility] Marcine Matar, MD       Future Appointments             In 2 weeks Marcine Matar, MD [facility] [place]igha Bridegroom. [facility]

## 2023-07-25 ENCOUNTER — Encounter: Payer: Self-pay | Admitting: Internal Medicine

## 2023-07-25 ENCOUNTER — Ambulatory Visit: Payer: Medicare PPO | Attending: Internal Medicine | Admitting: Internal Medicine

## 2023-07-25 VITALS — BP 149/101 | HR 103 | Temp 97.9°F | Ht 71.0 in | Wt 167.0 lb

## 2023-07-25 DIAGNOSIS — N529 Male erectile dysfunction, unspecified: Secondary | ICD-10-CM | POA: Diagnosis not present

## 2023-07-25 DIAGNOSIS — Z860100 Personal history of colon polyps, unspecified: Secondary | ICD-10-CM | POA: Diagnosis not present

## 2023-07-25 DIAGNOSIS — I10 Essential (primary) hypertension: Secondary | ICD-10-CM | POA: Diagnosis not present

## 2023-07-25 DIAGNOSIS — E785 Hyperlipidemia, unspecified: Secondary | ICD-10-CM | POA: Diagnosis not present

## 2023-07-25 DIAGNOSIS — F17200 Nicotine dependence, unspecified, uncomplicated: Secondary | ICD-10-CM

## 2023-07-25 DIAGNOSIS — F17210 Nicotine dependence, cigarettes, uncomplicated: Secondary | ICD-10-CM | POA: Diagnosis not present

## 2023-07-25 DIAGNOSIS — Z1211 Encounter for screening for malignant neoplasm of colon: Secondary | ICD-10-CM

## 2023-07-25 MED ORDER — VARENICLINE TARTRATE (STARTER) 0.5 MG X 11 & 1 MG X 42 PO TBPK
ORAL_TABLET | ORAL | 0 refills | Status: DC
Start: 2023-07-25 — End: 2023-07-25

## 2023-07-25 MED ORDER — TADALAFIL 20 MG PO TABS: ORAL_TABLET | ORAL | 6 refills | Status: AC

## 2023-07-25 MED ORDER — VARENICLINE TARTRATE (STARTER) 0.5 MG X 11 & 1 MG X 42 PO TBPK: ORAL_TABLET | ORAL | 0 refills | Status: AC

## 2023-07-25 MED ORDER — AMLODIPINE BESYLATE 10 MG PO TABS: 10.0000 mg | ORAL_TABLET | Freq: Every day | ORAL | 1 refills | Status: DC

## 2023-07-25 NOTE — Progress Notes (Signed)
 Patient ID: Albert Martin, male    DOB: May 03, 1951  MRN: [id number]  CC: Hypertension (HTN f/u. Franchot Erichsen cholesterol meds & hydrocholorothiazide/Already received flu vax)   Subjective: Albert Martin is a 73 y.o. male who presents for chronic ds management. His concerns today include:  Patient with history of HTN, HL, ED, low Testosterone, CTS, OA RT shoulder/knee, multiple colon polyps.   HL: No longer on statin.  He was tried with Pravachol and rosuvastatin.  Both cause nausea and upset stomach.   Eating healthy and exercising. Does wgh training and walks in the park on the weekends.  HTN: was on hydrochlorothiazide and Norvasc.  He self stopped the hydrochlorothiazide 6 mths ago because it caused him to urinate too much.  Taking only Norvasc.  Reports taking it consistently but based on last refill, he should have been out of it.  Did not take the Norvasc is here today. Limits salt. Eating more fruits No CP/SOB/LE  Low testosterone: He was on supplement in the past.  He has stopped taking it.  Does not want to get back on it.  He requests refill on Cialis for ED.    Tob:  down to 4-5 cigarettes/day and some days not at all.  Trying to quit.  Reports being prescribed Chantix in the past but did not take it consistently.  He would like to try it again.  HM: History of colon polyps.  Due for repeat colonoscopy. Patient Active Problem List   Diagnosis Date Noted   Positive hepatitis C antibody test 06/09/2021   Erythrocytosis 12/18/2018   History of colon polyps 09/15/2018   Low testosterone level in male 09/15/2018   Erectile dysfunction 09/15/2018   Primary osteoarthritis involving multiple joints 09/15/2018   Hyperlipidemia 05/26/2007   ERECTILE DYSFUNCTION 05/26/2007   TOBACCO ABUSE 05/26/2007   HEMATURIA UNSPECIFIED 05/26/2007   Osteoarthritis 05/26/2007   Arthropathy of shoulder region 05/26/2007   Spondylosis 05/26/2007   Essential hypertension 07/05/2006      Current Outpatient Medications on File Prior to Visit  Medication Sig Dispense Refill   DULoxetine (CYMBALTA) 20 MG capsule TAKE 1 CAPSULE BY MOUTH EVERY DAY 30 capsule 0   No current facility-administered medications on file prior to visit.    Allergies  Allergen Reactions   Statins     Social History   Socioeconomic History   Marital status: Married    Spouse name: Not on file   Number of children: 4   Years of education: Not on file   Highest education level: Not on file  Occupational History   Not on file  Tobacco Use   Smoking status: Every Day    Current packs/day: 0.00    Average packs/day: 0.3 packs/day for 10.0 years (2.5 ttl pk-yrs)    Types: Cigarettes    Start date: 08/16/1999    Last attempt to quit: 08/15/2009    Years since quitting: 13.9   Smokeless tobacco: Never  Vaping Use   Vaping status: Never Used  Substance and Sexual Activity   Alcohol use: No    Alcohol/week: 0.0 standard drinks of alcohol   Drug use: No   Sexual activity: Yes  Other Topics Concern   Not on file  Social History Narrative   Not on file   Social Drivers of Health   Financial Resource Strain: Low Risk  (10/26/2022)   Overall Financial Resource Strain (CARDIA)    Difficulty of Paying Living Expenses: Not hard at all  Food Insecurity:  No Food Insecurity (10/26/2022)   Hunger Vital Sign    Worried About Running Out of Food in the Last Year: Never true    Ran Out of Food in the Last Year: Never true  Transportation Needs: No Transportation Needs (10/26/2022)   PRAPARE - Administrator, Civil Service (Medical): No    Lack of Transportation (Non-Medical): No  Physical Activity: Sufficiently Active (10/26/2022)   Exercise Vital Sign    Days of Exercise per Week: 5 days    Minutes of Exercise per Session: 30 min  Stress: No Stress Concern Present (10/26/2022)   [employer] of Occupational Health - Occupational Stress Questionnaire    Feeling of Stress : Not at  all  Social Connections: Moderately Integrated (10/26/2022)   Social Connection and Isolation Panel [NHANES]    Frequency of Communication with Friends and Family: More than three times a week    Frequency of Social Gatherings with Friends and Family: Three times a week    Attends Religious Services: More than 4 times per year    Active Member of Clubs or Organizations: No    Attends Banker Meetings: Never    Marital Status: Married  Catering manager Violence: Not At Risk (10/26/2022)   Humiliation, Afraid, Rape, and Kick questionnaire    Fear of Current or Ex-Partner: No    Emotionally Abused: No    Physically Abused: No    Sexually Abused: No    Family History  Problem Relation Age of Onset   Lung cancer Mother    Hypertension Mother    Arthritis Mother    Esophageal cancer Mother    Hypertension Father    Arthritis Father    Hypertension Brother    Arthritis Brother    Lung cancer Maternal Aunt    Colon cancer Neg Hx    Colon polyps Neg Hx    Rectal cancer Neg Hx    Stomach cancer Neg Hx     Past Surgical History:  Procedure Laterality Date   INGUINAL HERNIA REPAIR     WISDOM TOOTH EXTRACTION      ROS: Review of Systems Negative except as stated above  PHYSICAL EXAM: BP (!) 149/101   Pulse (!) 103   Temp 97.9 F (36.6 C) (Oral)   Ht 5\' 11"  (1.803 m)   Wt 167 lb (75.8 kg)   SpO2 96%   BMI 23.29 kg/m   Physical Exam   General appearance - alert, well appearing, elderly African-American male and in no distress Mental status - normal mood, behavior, speech, dress, motor activity, and thought processes Chest - clear to auscultation, no wheezes, rales or rhonchi, symmetric air entry Heart - normal rate, regular rhythm, normal S1, S2, no murmurs, rubs, clicks or gallops Extremities - peripheral pulses normal, no pedal edema, no clubbing or cyanosis     Latest Ref Rng & Units 08/26/2022    4:18 PM 11/12/2021    9:23 AM 02/23/2021   11:39 AM   CMP  Glucose 70 - 99 mg/dL 88  478  77   BUN 8 - 27 mg/dL 12  17  12    Creatinine 0.76 - 1.27 mg/dL 2.95  6.21  3.08   Sodium 134 - 144 mmol/L 141  142  137   Potassium 3.5 - 5.2 mmol/L 4.0  4.2  3.9   Chloride 96 - 106 mmol/L 104  105  99   CO2 20 - 29 mmol/L 22  21  22  Calcium 8.6 - 10.2 mg/dL 9.1  9.5  9.7   Total Protein 6.0 - 8.5 g/dL  7.8  7.3   Total Bilirubin 0.0 - 1.2 mg/dL  0.6  0.8   Alkaline Phos 44 - 121 IU/L  106  107   AST 0 - 40 IU/L  11  11   ALT 0 - 44 IU/L  14  9    Lipid Panel     Component Value Date/Time   CHOL 206 (H) 08/26/2022 1618   TRIG 107 08/26/2022 1618   HDL 46 08/26/2022 1618   CHOLHDL 4.5 08/26/2022 1618   CHOLHDL 4.2 Ratio 07/01/2006 2201   VLDL 18 07/01/2006 2201   LDLCALC 141 (H) 08/26/2022 1618    CBC    Component Value Date/Time   WBC 9.8 11/12/2021 0923   WBC 8.5 06/08/2019 1041   RBC 5.38 11/12/2021 0923   RBC 5.26 06/08/2019 1041   HGB 14.9 11/12/2021 0923   HCT 45.3 11/12/2021 0923   PLT 252 11/12/2021 0923   MCV 84 11/12/2021 0923   MCH 27.7 11/12/2021 0923   MCH 27.8 06/08/2019 1041   MCHC 32.9 11/12/2021 0923   MCHC 32.2 06/08/2019 1041   RDW 12.2 11/12/2021 0923   LYMPHSABS 1.3 11/12/2021 0923   MONOABS 0.7 07/01/2006 2201   EOSABS 0.2 11/12/2021 0923   BASOSABS 0.0 11/12/2021 0923    ASSESSMENT AND PLAN: 1. Essential hypertension (Primary) Not at goal.  He has not taken amlodipine as yet for today.  He will take it when he returns home.  HCTZ taken off of med list as he stopped taking it because he did not like the side effect of frequent urination.  Will bring him back in 1 month for recheck. - CBC - Lipid panel - amLODipine (NORVASC) 10 MG tablet; Take 1 tablet (10 mg total) by mouth daily.  Dispense: 90 tablet; Refill: 1  2. Hyperlipidemia, unspecified hyperlipidemia type Encouraged him to continue healthy eating habits and regular exercise.  Recheck lipid profile today. - Comprehensive metabolic  panel  3. Tobacco dependence Advised to quit.  He is willing to give a trial of quitting.  Would like to try the Chantix again.  Prescription sent to his pharmacy for the starter pack.  Advised that the medication can cause mood swings and bad dreams.  4. History of colon polyps - Ambulatory referral to Gastroenterology  5. Screening for colon cancer - Ambulatory referral to Gastroenterology  6. Erectile dysfunction, unspecified erectile dysfunction type - tadalafil (CIALIS) 20 MG tablet; Take 1 tablet 1-2 hours before sexual activity PRN  Dispense: 30 tablet; Refill: 6   Patient was given the opportunity to ask questions.  Patient verbalized understanding of the plan and was able to repeat key elements of the plan.   This documentation was completed using Paediatric nurse.  Any transcriptional errors are unintentional.  Orders Placed This Encounter  Procedures   CBC   Comprehensive metabolic panel   Lipid panel   Ambulatory referral to Gastroenterology     Requested Prescriptions   Signed Prescriptions Disp Refills   amLODipine (NORVASC) 10 MG tablet 90 tablet 1    Sig: Take 1 tablet (10 mg total) by mouth daily.   tadalafil (CIALIS) 20 MG tablet 30 tablet 6    Sig: Take 1 tablet 1-2 hours before sexual activity PRN   Varenicline Tartrate, Starter, (CHANTIX STARTING MONTH PAK) 0.5 MG X 11 & 1 MG X 42 TBPK 53 each  0    Sig: 0.5 mg p.o. daily for days 1-3 then, 0.5 mg twice a day for days 4-7 then 1 mg twice a day    Return in about 1 month (around 08/25/2023) for BP recehck.  Jonah Blue, MD, FACP

## 2023-07-26 LAB — CBC
Hematocrit: 49.8 % (ref 37.5–51.0)
Hemoglobin: 15.9 g/dL (ref 13.0–17.7)
MCH: 27.5 pg (ref 26.6–33.0)
MCHC: 31.9 g/dL (ref 31.5–35.7)
MCV: 86 fL (ref 79–97)
Platelets: 255 10*3/uL (ref 150–450)
RBC: 5.79 x10E6/uL (ref 4.14–5.80)
RDW: 12.4 % (ref 11.6–15.4)
WBC: 10.9 10*3/uL — ABNORMAL HIGH (ref 3.4–10.8)

## 2023-07-26 LAB — COMPREHENSIVE METABOLIC PANEL
ALT: 12 IU/L (ref 0–44)
AST: 12 IU/L (ref 0–40)
Albumin: 4.5 g/dL (ref 3.8–4.8)
Alkaline Phosphatase: 109 IU/L (ref 44–121)
BUN/Creatinine Ratio: 13 (ref 10–24)
BUN: 13 mg/dL (ref 8–27)
Bilirubin Total: 1 mg/dL (ref 0.0–1.2)
CO2: 21 mmol/L (ref 20–29)
Calcium: 9.7 mg/dL (ref 8.6–10.2)
Chloride: 105 mmol/L (ref 96–106)
Creatinine, Ser: 1.03 mg/dL (ref 0.76–1.27)
Globulin, Total: 3.1 g/dL (ref 1.5–4.5)
Glucose: 93 mg/dL (ref 70–99)
Potassium: 4.4 mmol/L (ref 3.5–5.2)
Sodium: 142 mmol/L (ref 134–144)
Total Protein: 7.6 g/dL (ref 6.0–8.5)
eGFR: 76 mL/min/{1.73_m2} (ref >59)

## 2023-07-26 LAB — LIPID PANEL
Chol/HDL Ratio: 4.6 ratio (ref 0.0–5.0)
Cholesterol, Total: 231 mg/dL — ABNORMAL HIGH (ref 100–199)
HDL: 50 mg/dL (ref >39)
LDL Chol Calc (NIH): 160 mg/dL — ABNORMAL HIGH (ref 0–99)
Triglycerides: 118 mg/dL (ref 0–149)
VLDL Cholesterol Cal: 21 mg/dL (ref 5–40)

## 2023-07-27 ENCOUNTER — Encounter: Payer: Self-pay | Admitting: Internal Medicine

## 2023-09-11 NOTE — Progress Notes (Deleted)
 09/11/2023 Albert Martin [id number] [birth year]   CHIEF COMPLAINT: Discuss scheduling a colonoscopy   HISTORY OF PRESENT ILLNESS: Albert Martin is a 73 year old male with a past medica history of arthritis, hypertension, hyperlipidemia and colon polyps. He is known by Dr. General Kenner. He presents today to schedule a colonoscopy.      Latest Ref Rng & Units 07/25/2023    5:16 PM 11/12/2021    9:23 AM 02/23/2021   11:39 AM  CBC  WBC 3.4 - 10.8 x10E3/uL 10.9  9.8  9.9   Hemoglobin 13.0 - 17.7 g/dL 78.2  95.6  21.3   Hematocrit 37.5 - 51.0 % 49.8  45.3  43.8   Platelets 150 - 450 x10E3/uL 255  252  271        Latest Ref Rng & Units 07/25/2023    5:16 PM 08/26/2022    4:18 PM 11/12/2021    9:23 AM  CMP  Glucose 70 - 99 mg/dL 93  88  086   BUN 8 - 27 mg/dL 13  12  17    Creatinine 0.76 - 1.27 mg/dL 5.78  4.69  6.29   Sodium 134 - 144 mmol/L 142  141  142   Potassium 3.5 - 5.2 mmol/L 4.4  4.0  4.2   Chloride 96 - 106 mmol/L 105  104  105   CO2 20 - 29 mmol/L 21  22  21    Calcium  8.6 - 10.2 mg/dL 9.7  9.1  9.5   Total Protein 6.0 - 8.5 g/dL 7.6   7.8   Total Bilirubin 0.0 - 1.2 mg/dL 1.0   0.6   Alkaline Phos 44 - 121 IU/L 109   106   AST 0 - 40 IU/L 12   11   ALT 0 - 44 IU/L 12   14     Colonoscopy 02/11/2015: 1. Thirteen sessile polyps ranging from 3 to 6mm in size were found in the transverse colon (4), at the splenic flexure (1), in the ascending colon (1), and at the hepatic flexure (3), and in the rectum (4); polypectomies were performed with a cold snare  2. Three semi-pedunculated polyps ranging from 5 to 7mm in size were found in the sigmoid colon and descending colon; polypectomies were performed using snare cautery 3. Pedunculated polyp was found in the rectosigmoid colon; injection was given to allow the snare to fit around the head of the polyp. 1:10,000 Epinephrine solution; polypectomy was performed using snare cautery. Prophylactic hemostasis clip placed.  4.  The examination was otherwise normal 1. Surgical [P], ascending, hepatic flexure, transverse, splenic, polyp (16) - TUBULAR ADENOMAS (X8). - HYPERPLASTIC POLYPS. - POLYPOID COLONIC MUCOSAL FRAGMENTS WITH LYMPHOID AGGREGATE. - NO HIGH GRADE DYSPLASIA OR INVASIVE MALIGNANCY IDENTIFIED. - SEE COMMENT. 2. Surgical [P], sigmoid, polyp - TUBULOVILLOUS ADENOMA. NO HIGH GRADE DYSPLASIA OR MALIGNANCY IDENTIFIED.  Colonoscopy 12/15/2018: - One 4 mm polyp in the cecum, removed with a cold snare. Resected and retrieved. - One diminutive polyp in the ascending colon, removed with a cold snare. Resected and retrieved.  - Three 3 to 6 mm polyps at the hepatic flexure, removed with a cold snare. Resected and retrieved.  - Two 3 mm polyps in the transverse colon, removed with a cold snare. Resected and retrieved.  - Four 3 to 4 mm polyps at the recto-sigmoid colon, removed with a cold snare. Resected and retrieved.  - Internal hemorrhoids.  - The examination was otherwise normal. - Recall  colonoscopy 3 years -Genetic testing recommended, patient did not wish to purse   1. Surgical [P], colon, rectosigmoid, polyp (4) - HYPERPLASTIC POLYP(S) 2. Surgical [P], colon, transverse, ascending, cecum, hepatic flexure, polyp (7) - TUBULAR ADENOMA(S) WITHOUT HIGH-GRADE DYSPLASIA OR MALIGNANCY - INFLAMMATORY POLYP - HYPERPLASTIC POLYP  Past Medical History:  Diagnosis Date   Arthritis    Hyperlipidemia    Hypertension    Past Surgical History:  Procedure Laterality Date   INGUINAL HERNIA REPAIR     WISDOM TOOTH EXTRACTION     Social History:  reports that he has been smoking cigarettes. He started smoking about 24 years ago. He has a 2.5 pack-year smoking history. He has never used smokeless tobacco. He reports that he does not drink alcohol and does not use drugs.  Family History: family history includes Arthritis in his brother, father, and mother; Esophageal cancer in his mother; Hypertension in his  brother, father, and mother; Lung cancer in his maternal aunt and mother.  Allergies  Allergen Reactions   Statins Other (See Comments)    Nausea and dizziness with Crestor  and Pravachol .      Outpatient Encounter Medications as of 09/12/2023  Medication Sig   amLODipine  (NORVASC ) 10 MG tablet Take 1 tablet (10 mg total) by mouth daily.   DULoxetine  (CYMBALTA ) 20 MG capsule TAKE 1 CAPSULE BY MOUTH EVERY DAY   tadalafil  (CIALIS ) 20 MG tablet Take 1 tablet 1-2 hours before sexual activity PRN   Varenicline  Tartrate, Starter, (CHANTIX  STARTING MONTH PAK) 0.5 MG X 11 & 1 MG X 42 TBPK 0.5 mg p.o. daily for days 1-3 then, 0.5 mg twice a day for days 4-7 then 1 mg twice a day   No facility-administered encounter medications on file as of 09/12/2023.   REVIEW OF SYSTEMS:  Gen: Denies fever, sweats or chills. No weight loss.  CV: Denies chest pain, palpitations or edema. Resp: Denies cough, shortness of breath of hemoptysis.  GI: Denies heartburn, dysphagia, stomach or lower abdominal pain. No diarrhea or constipation.  GU: Denies urinary burning, blood in urine, increased urinary frequency or incontinence. MS: Denies joint pain, muscles aches or weakness. Derm: Denies rash, itchiness, skin lesions or unhealing ulcers. Psych: Denies depression, anxiety, memory loss or confusion. Heme: Denies bruising, easy bleeding. Neuro:  Denies headaches, dizziness or paresthesias. Endo:  Denies any problems with DM, thyroid or adrenal function.  PHYSICAL EXAM: There were no vitals taken for this visit. General: in no acute distress. Head: Normocephalic and atraumatic. Eyes:  Sclerae non-icteric, conjunctive pink. Ears: Normal auditory acuity. Mouth: Dentition intact. No ulcers or lesions.  Neck: Supple, no lymphadenopathy or thyromegaly.  Lungs: Clear bilaterally to auscultation without wheezes, crackles or rhonchi. Heart: Regular rate and rhythm. No murmur, rub or gallop appreciated.  Abdomen:  Soft, nontender, nondistended. No masses. No hepatosplenomegaly. Normoactive bowel sounds x 4 quadrants.  Rectal: Deferred.  Musculoskeletal: Symmetrical with no gross deformities. Skin: Warm and dry. No rash or lesions on visible extremities. Extremities: No edema. Neurological: Alert oriented x 4, no focal deficits.  Psychological:  Alert and cooperative. Normal mood and affect.  ASSESSMENT AND PLAN:    CC:  Lawrance Presume, MD

## 2023-09-12 ENCOUNTER — Ambulatory Visit: Admitting: Nurse Practitioner

## 2023-09-12 ENCOUNTER — Ambulatory Visit: Attending: Internal Medicine | Admitting: Internal Medicine

## 2023-10-06 ENCOUNTER — Other Ambulatory Visit: Payer: Self-pay | Admitting: Physician Assistant

## 2023-10-06 DIAGNOSIS — M150 Primary generalized (osteo)arthritis: Secondary | ICD-10-CM

## 2024-01-17 IMAGING — DX DG CHEST 2V
2 series · 2 of 2 positions shown · non-contrast
Comparison: Chest radiograph 06/07/2018

CLINICAL DATA: History of pneumonia.

EXAM:
CHEST - 2 VIEW

[chest pa]
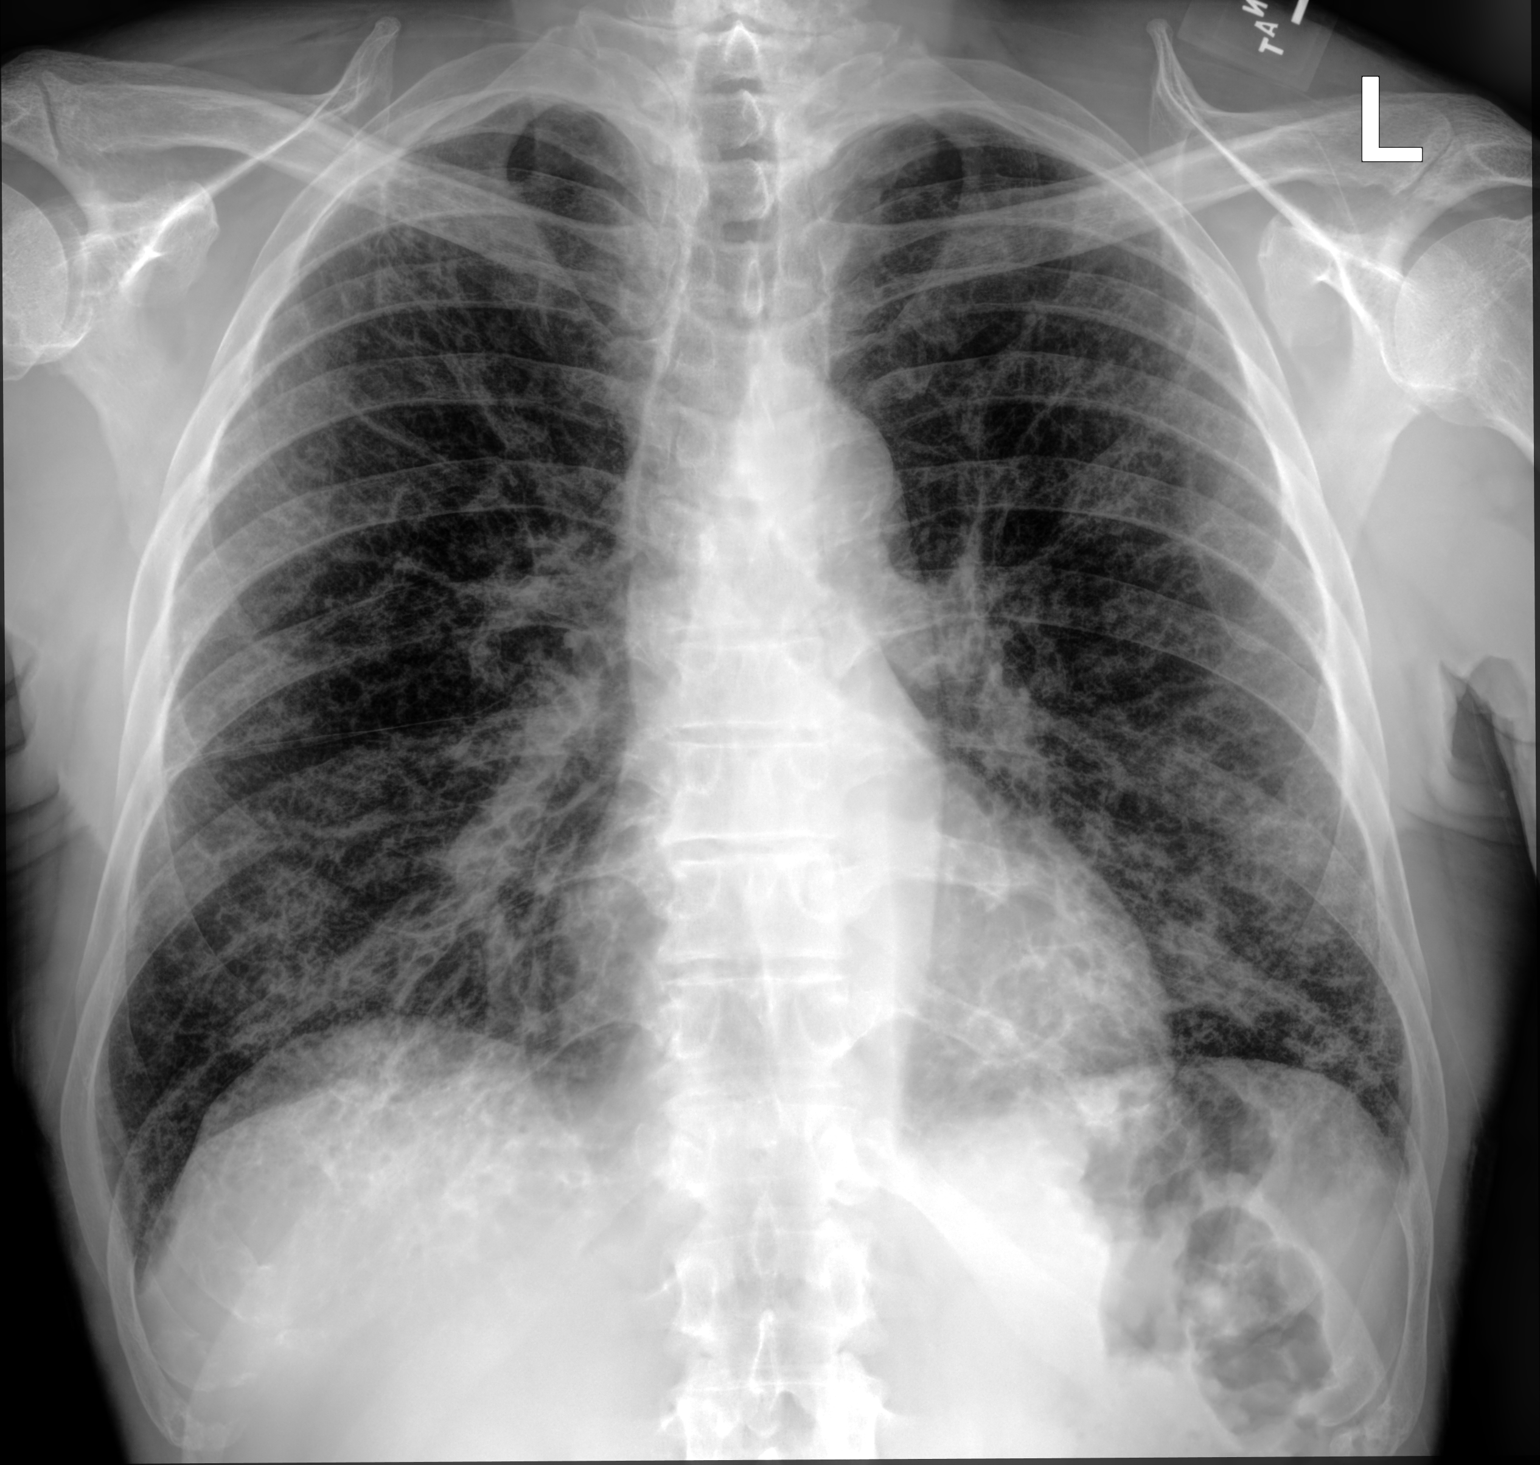

[chest lat]
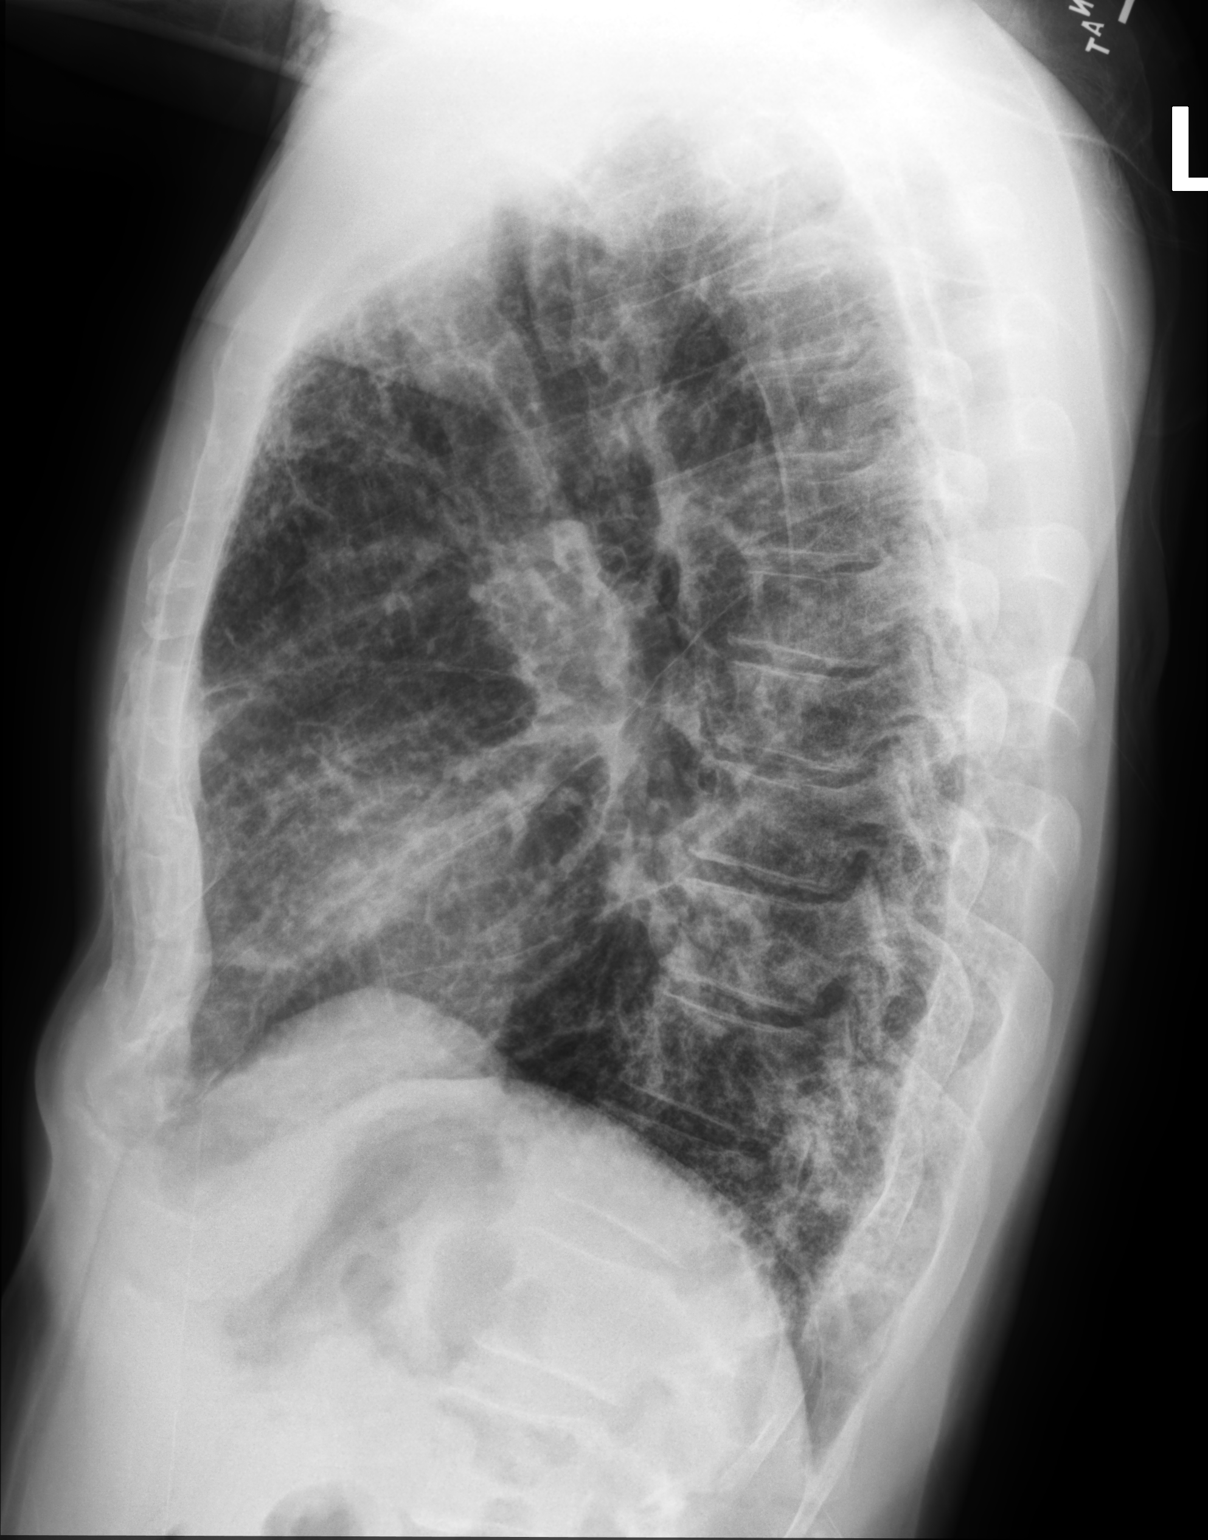

[2 of 2 positions shown; findings below may reference images not displayed]

FINDINGS: Slight interval progression of coarse interstitial opacities. Stable
cardiac and mediastinal contours. No pleural effusion or
pneumothorax. Osseous structures unremarkable.
IMPRESSION: Interval progression of coarse interstitial opacities. Recommend
further evaluation with high-resolution chest CT to evaluate for
underlying pulmonary fibrosis.

## 2024-02-06 ENCOUNTER — Other Ambulatory Visit: Payer: Self-pay | Admitting: Internal Medicine

## 2024-02-06 DIAGNOSIS — M150 Primary generalized (osteo)arthritis: Secondary | ICD-10-CM

## 2024-02-07 NOTE — Telephone Encounter (Signed)
 Requested medication (s) are due for refill today: Yes  Requested medication (s) are on the active medication list: Yes  Last refill:  10/08/23  Future visit scheduled: No  Notes to clinic:  Manual review.    Requested Prescriptions  Pending Prescriptions Disp Refills   meloxicam  (MOBIC ) 15 MG tablet [Pharmacy Med Name: meloxicam  15 mg tablet] 90 tablet 1    Sig: Take 1 tablet by mouth daily.     Analgesics:  COX2 Inhibitors Failed - 02/07/2024 12:25 PM      Failed - Manual Review: Labs are only required if the patient has taken medication for more than 8 weeks.      Passed - HGB in normal range and within 360 days    Hemoglobin  Date Value Ref Range Status  07/25/2023 15.9 13.0 - 17.7 g/dL Final         Passed - Cr in normal range and within 360 days    Creatinine, Ser  Date Value Ref Range Status  07/25/2023 1.03 0.76 - 1.27 mg/dL Final         Passed - HCT in normal range and within 360 days    Hematocrit  Date Value Ref Range Status  07/25/2023 49.8 37.5 - 51.0 % Final         Passed - AST in normal range and within 360 days    AST  Date Value Ref Range Status  07/25/2023 12 0 - 40 IU/L Final         Passed - ALT in normal range and within 360 days    ALT  Date Value Ref Range Status  07/25/2023 12 0 - 44 IU/L Final         Passed - eGFR is 30 or above and within 360 days    GFR calc Af Amer  Date Value Ref Range Status  06/08/2019 >60 >60 mL/min Final   GFR calc non Af Amer  Date Value Ref Range Status  06/08/2019 >60 >60 mL/min Final   eGFR  Date Value Ref Range Status  07/25/2023 76 >59 mL/min/1.73 Final         Passed - Patient is not pregnant      Passed - Valid encounter within last 12 months    Recent Outpatient Visits           6 months ago Essential hypertension   [facility] [place]. [facility] Barnie NOVAK, MD   1 year ago Hyperglycemia   [facility]  [place]. [facility] [place], [place], [place]   2 years ago Erectile dysfunction, unspecified erectile dysfunction type   [facility] [place]. [facility], Jon HER[place], [place]   2 years ago Acute bacterial bronchitis   [facility], [place] RAMAN, [place]   2 years ago Encounter for [employer] annual wellness exam    [facility] [place]. [facility] Barnie NOVAK, MD

## 2024-02-20 ENCOUNTER — Telehealth: Payer: Self-pay | Admitting: Internal Medicine

## 2024-02-20 NOTE — Telephone Encounter (Signed)
 Copied from CRM [id number]. Topic: General - Other >> Feb 20, 2024  1:18 PM Turkey B wrote:  Reason for CRM: Leighann from [employer] called about getting if we have the latest bp reading for the patient as she states he isnt on med for it

## 2024-02-21 NOTE — Telephone Encounter (Signed)
 Hi Clarisa,   I attempted to contact patient twice, No answer, unable to leave voicemail.

## 2024-02-22 NOTE — Telephone Encounter (Signed)
 FYI. Called but no answer. Unable to LVM due to no VM set up. Letter sent via mail to call & schedule a follow-up appointment.

## 2024-03-17 ENCOUNTER — Ambulatory Visit (HOSPITAL_COMMUNITY)
Admission: EM | Admit: 2024-03-17 | Discharge: 2024-03-17 | Disposition: A | Discharge status: Home / Self Care | Attending: Family Medicine | Admitting: Family Medicine

## 2024-03-17 ENCOUNTER — Emergency Department (HOSPITAL_COMMUNITY)
Admission: EM | Admit: 2024-03-17 | Discharge: 2024-03-17 | Disposition: A | Discharge status: Home / Self Care | Attending: Emergency Medicine | Admitting: Emergency Medicine

## 2024-03-17 ENCOUNTER — Emergency Department (HOSPITAL_COMMUNITY)

## 2024-03-17 ENCOUNTER — Encounter (HOSPITAL_COMMUNITY): Payer: Self-pay

## 2024-03-17 ENCOUNTER — Other Ambulatory Visit: Payer: Self-pay

## 2024-03-17 DIAGNOSIS — S00212A Abrasion of left eyelid and periocular area, initial encounter: Secondary | ICD-10-CM | POA: Insufficient documentation

## 2024-03-17 DIAGNOSIS — Z79899 Other long term (current) drug therapy: Secondary | ICD-10-CM | POA: Insufficient documentation

## 2024-03-17 DIAGNOSIS — M5030 Other cervical disc degeneration, unspecified cervical region: Secondary | ICD-10-CM | POA: Diagnosis not present

## 2024-03-17 DIAGNOSIS — N281 Cyst of kidney, acquired: Secondary | ICD-10-CM | POA: Diagnosis not present

## 2024-03-17 DIAGNOSIS — S299XXA Unspecified injury of thorax, initial encounter: Secondary | ICD-10-CM | POA: Diagnosis not present

## 2024-03-17 DIAGNOSIS — R0789 Other chest pain: Secondary | ICD-10-CM | POA: Diagnosis not present

## 2024-03-17 DIAGNOSIS — S3993XA Unspecified injury of pelvis, initial encounter: Secondary | ICD-10-CM | POA: Diagnosis not present

## 2024-03-17 DIAGNOSIS — M47812 Spondylosis without myelopathy or radiculopathy, cervical region: Secondary | ICD-10-CM | POA: Diagnosis not present

## 2024-03-17 DIAGNOSIS — W19XXXA Unspecified fall, initial encounter: Secondary | ICD-10-CM

## 2024-03-17 DIAGNOSIS — Z23 Encounter for immunization: Secondary | ICD-10-CM | POA: Insufficient documentation

## 2024-03-17 DIAGNOSIS — J984 Other disorders of lung: Secondary | ICD-10-CM | POA: Diagnosis not present

## 2024-03-17 DIAGNOSIS — I10 Essential (primary) hypertension: Secondary | ICD-10-CM | POA: Diagnosis not present

## 2024-03-17 DIAGNOSIS — S0990XA Unspecified injury of head, initial encounter: Secondary | ICD-10-CM

## 2024-03-17 DIAGNOSIS — Z043 Encounter for examination and observation following other accident: Secondary | ICD-10-CM | POA: Diagnosis not present

## 2024-03-17 DIAGNOSIS — S0993XA Unspecified injury of face, initial encounter: Secondary | ICD-10-CM

## 2024-03-17 DIAGNOSIS — S199XXA Unspecified injury of neck, initial encounter: Secondary | ICD-10-CM | POA: Diagnosis not present

## 2024-03-17 DIAGNOSIS — S3991XA Unspecified injury of abdomen, initial encounter: Secondary | ICD-10-CM | POA: Diagnosis not present

## 2024-03-17 DIAGNOSIS — G9389 Other specified disorders of brain: Secondary | ICD-10-CM | POA: Diagnosis not present

## 2024-03-17 DIAGNOSIS — R9082 White matter disease, unspecified: Secondary | ICD-10-CM | POA: Diagnosis not present

## 2024-03-17 DIAGNOSIS — M4802 Spinal stenosis, cervical region: Secondary | ICD-10-CM | POA: Diagnosis not present

## 2024-03-17 DIAGNOSIS — W108XXA Fall (on) (from) other stairs and steps, initial encounter: Secondary | ICD-10-CM | POA: Insufficient documentation

## 2024-03-17 LAB — CBC
HCT: 44.6 % (ref 39.0–52.0)
Hemoglobin: 14.3 g/dL (ref 13.0–17.0)
MCH: 27.5 pg (ref 26.0–34.0)
MCHC: 32.1 g/dL (ref 30.0–36.0)
MCV: 85.8 fL (ref 80.0–100.0)
Platelets: 239 K/uL (ref 150–400)
RBC: 5.2 MIL/uL (ref 4.22–5.81)
RDW: 13 % (ref 11.5–15.5)
WBC: 11.7 K/uL — ABNORMAL HIGH (ref 4.0–10.5)
nRBC: 0 % (ref 0.0–0.2)

## 2024-03-17 LAB — COMPREHENSIVE METABOLIC PANEL WITH GFR
ALT: 13 U/L (ref 0–44)
AST: 15 U/L (ref 15–41)
Albumin: 3.8 g/dL (ref 3.5–5.0)
Alkaline Phosphatase: 73 U/L (ref 38–126)
Anion gap: 11 (ref 5–15)
BUN: 10 mg/dL (ref 8–23)
CO2: 25 mmol/L (ref 22–32)
Calcium: 9.1 mg/dL (ref 8.9–10.3)
Chloride: 104 mmol/L (ref 98–111)
Creatinine, Ser: 0.93 mg/dL (ref 0.61–1.24)
GFR, Estimated: >60 mL/min (ref >60)
Glucose, Bld: 94 mg/dL (ref 70–99)
Potassium: 3.8 mmol/L (ref 3.5–5.1)
Sodium: 140 mmol/L (ref 135–145)
Total Bilirubin: 1.5 mg/dL — ABNORMAL HIGH (ref 0.0–1.2)
Total Protein: 7 g/dL (ref 6.5–8.1)

## 2024-03-17 LAB — I-STAT CHEM 8, ED
BUN: 11 mg/dL (ref 8–23)
Calcium, Ion: 1.1 mmol/L — ABNORMAL LOW (ref 1.15–1.40)
Chloride: 105 mmol/L (ref 98–111)
Creatinine, Ser: 0.9 mg/dL (ref 0.61–1.24)
Glucose, Bld: 94 mg/dL (ref 70–99)
HCT: 43 % (ref 39.0–52.0)
Hemoglobin: 14.6 g/dL (ref 13.0–17.0)
Potassium: 3.8 mmol/L (ref 3.5–5.1)
Sodium: 141 mmol/L (ref 135–145)
TCO2: 25 mmol/L (ref 22–32)

## 2024-03-17 MED ORDER — TETANUS-DIPHTH-ACELL PERTUSSIS 5-2-15.5 LF-MCG/0.5 IM SUSP
0.5000 mL | Freq: Once | INTRAMUSCULAR | Status: AC
  Administered 2024-03-17: 0.5 mL via INTRAMUSCULAR
  Filled 2024-03-17: qty 0.5

## 2024-03-17 MED ORDER — IOHEXOL 350 MG/ML SOLN
75.0000 mL | Freq: Once | INTRAVENOUS | Status: AC | PRN
  Administered 2024-03-17: 75 mL via INTRAVENOUS

## 2024-03-17 NOTE — ED Triage Notes (Addendum)
 Patient reports that he tripped at the top of a flight of stairs and fell to the bottom of the stairs, hitting his face and head. Patient states he broke his glasses. Patient reports that he was knocked out and someone helped him up Patient has 2 lacerations around the left eye. Bleeding controlled.  Patient also c/o left rib soreness and dizziness this AM, but no dizziness at this time.

## 2024-03-17 NOTE — ED Notes (Signed)
 Patient is being discharged from the Urgent Care and sent to the Emergency Department via [facility] . Per Dr. Rolinda, patient is in need of higher level of care due to fall, head injury. Patient is aware and verbalizes understanding of plan of care.  Vitals:   03/17/24 1204  BP: (!) 190/100  Pulse: (!) 105  Temp: 98.5 F (36.9 C)  SpO2: 99%

## 2024-03-17 NOTE — ED Notes (Signed)
 [facility] called for transportation to [facility] ED.

## 2024-03-17 NOTE — ED Notes (Signed)
 The pt reports that he has no way home

## 2024-03-17 NOTE — ED Provider Notes (Addendum)
 [facility]   [id number] 03/17/24 Arrival Time: 1156  ASSESSMENT & PLAN:  1. Injury of head, initial encounter   2. Fall, initial encounter   3. Facial injury, initial encounter    Seen in triage room with RN. No blood thinner use. Suspected LOC. Pt reports fall down 12 stairs at home approx 5 hours ago. Witnessed. Told me I passed out and was laying there for a little bit. Does not remember tripping. Reports pain and swelling around L orbit; denies double vision; glasses broke with fall. Ambulatory since; walked here from home. Denies HA/n/v. Denies neck pain. Also mentions L rib pai; denies SOB.  [facility] called for transport to ED; stable upon discharge.  Reviewed expectations re: course of current medical issues. Questions answered. Outlined signs and symptoms indicating need for more acute intervention. Patient verbalized understanding. After Visit Summary given.  OBJECTIVE:  Vitals:   03/17/24 1204  BP: (!) 190/100  Pulse: (!) 105  Temp: 98.5 F (36.9 C)  TempSrc: Oral  SpO2: 99%    Elevated BP and HR noted.  General appearance: alert; NAD HENT: normocephalic; swelling and small cut to upper LEFT orbit; is TTP; no bleeding from ears Eyes: PERRLA; EOMI; conjunctivae normal Neck: supple with FROM; without any midline TTP or pain with movement Lungs: clear to auscultation bilaterally; unlabored respirations Extremities: no edema; symmetrical with no gross deformities Skin: warm and dry Neurologic: alert; speech is fluent and clear without dysarthria or aphasia; CN 2-12 grossly intact Psychological: alert and cooperative; normal mood and affect    Allergies  Allergen Reactions   Statins Other (See Comments)    Nausea and dizziness with Crestor  and Pravachol .    Past Medical History:  Diagnosis Date   Arthritis    Hyperlipidemia    Hypertension    Social History   Socioeconomic History   Marital status: Married    Spouse name: Not on file    Number of children: 4   Years of education: Not on file   Highest education level: Not on file  Occupational History   Not on file  Tobacco Use   Smoking status: Some Days    Current packs/day: 0.00    Average packs/day: 0.3 packs/day for 10.0 years (2.5 ttl pk-yrs)    Types: Cigarettes    Start date: 08/16/1999    Last attempt to quit: 08/15/2009    Years since quitting: 14.5   Smokeless tobacco: Never  Vaping Use   Vaping status: Never Used  Substance and Sexual Activity   Alcohol use: No    Alcohol/week: 0.0 standard drinks of alcohol   Drug use: No   Sexual activity: Yes  Other Topics Concern   Not on file  Social History Narrative   Not on file   Social Drivers of Health   Financial Resource Strain: Low Risk  (10/26/2022)   Overall Financial Resource Strain (CARDIA)    Difficulty of Paying Living Expenses: Not hard at all  Food Insecurity: No Food Insecurity (10/26/2022)   Hunger Vital Sign    Worried About Running Out of Food in the Last Year: Never true    Ran Out of Food in the Last Year: Never true  Transportation Needs: No Transportation Needs (10/26/2022)   PRAPARE - Administrator, Civil Service (Medical): No    Lack of Transportation (Non-Medical): No  Physical Activity: Sufficiently Active (10/26/2022)   Exercise Vital Sign    Days of Exercise per Week: 5 days  Minutes of Exercise per Session: 30 min  Stress: No Stress Concern Present (10/26/2022)   [employer] of Occupational Health - Occupational Stress Questionnaire    Feeling of Stress : Not at all  Social Connections: Moderately Integrated (10/26/2022)   Social Connection and Isolation Panel    Frequency of Communication with Friends and Family: More than three times a week    Frequency of Social Gatherings with Friends and Family: Three times a week    Attends Religious Services: More than 4 times per year    Active Member of Clubs or Organizations: No    Attends Banker  Meetings: Never    Marital Status: Married  Catering Manager Violence: Not At Risk (10/26/2022)   Humiliation, Afraid, Rape, and Kick questionnaire    Fear of Current or Ex-Partner: No    Emotionally Abused: No    Physically Abused: No    Sexually Abused: No   Family History  Problem Relation Age of Onset   Lung cancer Mother    Hypertension Mother    Arthritis Mother    Esophageal cancer Mother    Hypertension Father    Arthritis Father    Hypertension Brother    Arthritis Brother    Lung cancer Maternal Aunt    Colon cancer Neg Hx    Colon polyps Neg Hx    Rectal cancer Neg Hx    Stomach cancer Neg Hx    Past Surgical History:  Procedure Laterality Date   INGUINAL HERNIA REPAIR     WISDOM TOOTH EXTRACTION        Rolinda Rogue, MD 03/17/24 1229    Rolinda Rogue, MD 03/17/24 1235

## 2024-03-17 NOTE — ED Provider Notes (Signed)
 Patient care assumed from previous provider.   Patient care of Albert Martin is a 73 y.o. male from previous provider. Please see the original provider note from this emergency department encounter for full history and physical.   Course of Care and my assessment at the time of sign out is detailed in the ED Course below.   Clinical Course as of 03/17/24 1539  Sat Mar 17, 2024  1504 S. 73y M, fell down 14 stairs. Walked to UC post fall. Abrasion on eyebrow, HA, L rib pain. - thinners []  pending scans [SA]    Clinical Course User Index [SA] Billy Pal, MD    In short this is 73 year old male with history HTN, HLD, presenting with mechanical fall. He is hemodynamically stable with slight hypertension, appears well, nontoxic appearing, no obvious external signs of trauma. CT chest abdomen pelvis without any acute traumatic injuries.  CT head, face, C-spine and CXR without any acute abnormalities.  CMP, i-STAT and CBC without medically relevant abnormalities.  WBC slightly elevated 11.7, patient does not endorse any infectious symptoms, imaging imaging is reassuring against any acute emergent infection  Labs reviewed by myself and considered in medical decision making.  Imaging reviewed by myself and considered in medical decision making. Imaging final read interpreted by radiology.  No diagnosis found.   Patient discharged in stable condition.  The plan for this patient was discussed with Dr. Ruthe Cornet, DO , who voiced agreement and who oversaw evaluation and treatment of this patient.     Billy Pal, MD 03/17/24 1747    Ruthe Cornet, DO 03/17/24 1750

## 2024-03-17 NOTE — Discharge Instructions (Addendum)
 Albert Martin:  Thank you for allowing us  to take care of you today.  We hope you begin feeling better soon. You were seen today for fall. While you were here, we performed lab work and x-ray and CT imaging that was reassuring against any acute traumatic injury.   To-Do:  Please follow-up with your primary doctor within the next 2-3 days. It is important that you review any labs or imaging results (if any) that you had today with them. Your preliminary imaging results (if any) are attached. Please return to the Emergency Department or call 911 if you experience chest pain, shortness of breath, severe pain, severe fever, altered mental status, or have any reason to think that you need emergency medical care.  Thank you again.  Hope you feel better soon.  Department of Emergency Medicine

## 2024-03-17 NOTE — ED Notes (Signed)
 Warren, RN notified of patient coming via [facility] for a fall down a flight of stairs, LOC, heada injury, and lacerations around the left eye.

## 2024-03-17 NOTE — ED Provider Notes (Signed)
 [place] EMERGENCY DEPARTMENT AT [facility] Provider Note   CSN: [id number] Arrival date & time: 03/17/24  1300     Patient presents with: Fall   Albert Martin is a 73 y.o. male.  {Add pertinent medical, surgical, social history, OB history to YEP:67052} Patient with history of hypertension, hyperlipidemia presents today with complaints of mechanical fall.  Reports that same occurred earlier this morning when his cat walked in between his legs when he was walking down the steps causing him to lose his balance and fall down approximately 15 wooden steps.  He did hit his head and when he reached the bottom of the stairs he lost consciousness.  He has not had chelated.  He was able to get up and walk approximately 8 blocks to urgent care where he was seen and evaluated and recommended to come here for further evaluation.  He reports a headache as well as left-sided chest pain.  Denies shortness of breath.  He is unsure of his last tetanus.  The history is provided by the patient. No language interpreter was used.  Fall       Prior to Admission medications   Medication Sig Start Date End Date Taking? Authorizing Provider  amLODipine  (NORVASC ) 10 MG tablet Take 1 tablet (10 mg total) by mouth daily. 07/25/23   Vicci Barnie NOVAK, MD  DULoxetine  (CYMBALTA ) 20 MG capsule TAKE 1 CAPSULE BY MOUTH EVERY DAY 07/06/23   Vicci Barnie NOVAK, MD  meloxicam  (MOBIC ) 15 MG tablet Take 1 tablet by mouth daily. Patient not taking: Reported on 03/17/2024 10/08/23   Vicci Barnie NOVAK, MD  tadalafil  (CIALIS ) 20 MG tablet Take 1 tablet 1-2 hours before sexual activity PRN 07/25/23   Vicci Barnie NOVAK, MD  Varenicline  Tartrate, Starter, (CHANTIX  STARTING MONTH PAK) 0.5 MG X 11 & 1 MG X 42 TBPK 0.5 mg p.o. daily for days 1-3 then, 0.5 mg twice a day for days 4-7 then 1 mg twice a day Patient not taking: Reported on 03/17/2024 07/25/23   Vicci Barnie NOVAK, MD    Allergies: Statins    Review of  Systems  Updated Vital Signs BP (!) 157/104 (BP Location: Left Arm)   Pulse 79   Temp 98.2 F (36.8 C) (Oral)   Resp 20   Ht 6' 2 (1.88 m)   Wt 77.1 kg   SpO2 100%   BMI 21.83 kg/m   Physical Exam  (all labs ordered are listed, but only abnormal results are displayed) Labs Reviewed  CBC  COMPREHENSIVE METABOLIC PANEL WITH GFR  I-STAT CHEM 8, ED    EKG: EKG Interpretation Date/Time:  Saturday March 17 2024 13:12:35 EDT Ventricular Rate:  77 PR Interval:  163 QRS Duration:  113 QT Interval:  415 QTC Calculation: 470 R Axis:   -61  Text Interpretation: Sinus rhythm Left anterior fascicular block Abnormal R-wave progression, late transition Probable left ventricular hypertrophy No significant change since last tracing Confirmed by Dreama Longs ([id number]) on 03/17/2024 1:14:15 PM  Radiology: [facility] Chest Portable 1 View Result Date: 03/17/2024 EXAM: 1 VIEW(S) XRAY OF THE CHEST 03/17/2024 01:34:00 PM COMPARISON: 09/02/2021 CLINICAL HISTORY: fall fall FINDINGS: LUNGS AND PLEURA: Stable bibasilar interstitial densities are noted most consistent with scarring. No focal pulmonary opacity. No pulmonary edema. No pleural effusion. No pneumothorax. HEART AND MEDIASTINUM: No acute abnormality of the cardiac and mediastinal silhouettes. BONES AND SOFT TISSUES: No acute osseous abnormality. IMPRESSION: 1. No acute cardiopulmonary process. 2. Stable bibasilar interstitial scarring. Electronically  signed by: Lynwood Seip MD 03/17/2024 01:45 PM EDT RP Workstation: [id number]    {Document cardiac monitor, telemetry assessment procedure when appropriate:32947} Procedures   Medications Ordered in the ED  Tdap (ADACEL) injection 0.5 mL (has no administration in time range)      {Click here for ABCD2, HEART and other calculators REFRESH Note before signing:1}                              Medical Decision Making Amount and/or Complexity of Data Reviewed Labs: ordered. Radiology:  ordered.  Risk Prescription drug management.   ***  {Document critical care time when appropriate  Document review of labs and clinical decision tools ie CHADS2VASC2, etc  Document your independent review of radiology images and any outside records  Document your discussion with family members, caretakers and with consultants  Document social determinants of health affecting pt's care  Document your decision making why or why not admission, treatments were needed:32947:::1}   Final diagnoses:  None    ED Discharge Orders     None

## 2024-03-17 NOTE — ED Notes (Signed)
 Pt to ct

## 2024-03-17 NOTE — ED Triage Notes (Signed)
 Pt bib [facility] from c/o mechanical fall. Pt said a cat ran through his legs and fell down approximately 14 wood steps about 6 hours ago.  Pt has a laceration above left eye lid. Bleeding is controlled. Pt states his ribs hurt on the left side.  Pt LOC and unknown for how long.  Hx HTN

## 2024-03-17 NOTE — ED Notes (Signed)
 RN and Paramedic attempted a total of two times for IV access. Unsuccessful. IV team consulted  RN cleaned blood of face.

## 2024-06-05 ENCOUNTER — Other Ambulatory Visit: Payer: Self-pay | Admitting: *Deleted

## 2024-06-05 DIAGNOSIS — I10 Essential (primary) hypertension: Secondary | ICD-10-CM

## 2024-06-05 MED ORDER — HYDROCHLOROTHIAZIDE 12.5 MG PO TABS: 25.0000 mg | ORAL_TABLET | Freq: Every day | ORAL | 0 refills | Status: AC

## 2024-06-05 MED ORDER — AMLODIPINE BESYLATE 10 MG PO TABS: 10.0000 mg | ORAL_TABLET | Freq: Every day | ORAL | 0 refills | Status: AC

## 2024-06-05 NOTE — Progress Notes (Signed)
 Came by for a med refill. He is tol his meds well. BP 120/80, P 84, PO 98 % RA Ok for 30 day RF. 30 day FU
